# Patient Record
Sex: Female | Born: 1935 | Race: White | Hispanic: No | Smoking: Former smoker
Health system: Southern US, Community
[De-identification: ages and names within clinical notes are randomized; demographics above are authoritative.]

## PROBLEM LIST (undated history)

## (undated) DIAGNOSIS — H6690 Otitis media, unspecified, unspecified ear: Secondary | ICD-10-CM

## (undated) DIAGNOSIS — I10 Essential (primary) hypertension: Secondary | ICD-10-CM

## (undated) HISTORY — PX: WRIST SURGERY: SHX841

## (undated) HISTORY — PX: SHOULDER SURGERY: SHX246

---

## 1998-10-22 ENCOUNTER — Encounter: Payer: Self-pay | Admitting: Internal Medicine

## 1998-10-22 ENCOUNTER — Encounter (INDEPENDENT_AMBULATORY_CARE_PROVIDER_SITE_OTHER): Payer: Self-pay | Admitting: Specialist

## 1998-10-22 ENCOUNTER — Ambulatory Visit (HOSPITAL_COMMUNITY): Admission: RE | Admit: 1998-10-22 | Discharge: 1998-10-22 | Payer: Self-pay | Admitting: Internal Medicine

## 1999-03-29 ENCOUNTER — Encounter: Payer: Self-pay | Admitting: Internal Medicine

## 1999-03-29 ENCOUNTER — Ambulatory Visit (HOSPITAL_COMMUNITY): Admission: RE | Admit: 1999-03-29 | Discharge: 1999-03-29 | Payer: Self-pay | Admitting: Internal Medicine

## 2000-03-31 ENCOUNTER — Ambulatory Visit (HOSPITAL_COMMUNITY): Admission: RE | Admit: 2000-03-31 | Discharge: 2000-03-31 | Payer: Self-pay | Admitting: Internal Medicine

## 2000-03-31 ENCOUNTER — Encounter: Payer: Self-pay | Admitting: Internal Medicine

## 2000-05-21 ENCOUNTER — Ambulatory Visit (HOSPITAL_COMMUNITY): Admission: RE | Admit: 2000-05-21 | Discharge: 2000-05-21 | Payer: Self-pay | Admitting: Orthopedic Surgery

## 2000-05-21 ENCOUNTER — Encounter: Payer: Self-pay | Admitting: Orthopedic Surgery

## 2001-04-02 ENCOUNTER — Encounter: Payer: Self-pay | Admitting: Internal Medicine

## 2001-04-02 ENCOUNTER — Ambulatory Visit (HOSPITAL_COMMUNITY): Admission: RE | Admit: 2001-04-02 | Discharge: 2001-04-02 | Payer: Self-pay | Admitting: Internal Medicine

## 2002-02-18 ENCOUNTER — Encounter: Payer: Self-pay | Admitting: Internal Medicine

## 2002-02-18 ENCOUNTER — Encounter: Admission: RE | Admit: 2002-02-18 | Discharge: 2002-02-18 | Payer: Self-pay | Admitting: Internal Medicine

## 2002-04-04 ENCOUNTER — Ambulatory Visit (HOSPITAL_COMMUNITY): Admission: RE | Admit: 2002-04-04 | Discharge: 2002-04-04 | Payer: Self-pay | Admitting: Internal Medicine

## 2002-04-04 ENCOUNTER — Encounter: Payer: Self-pay | Admitting: Internal Medicine

## 2002-05-04 ENCOUNTER — Encounter: Admission: RE | Admit: 2002-05-04 | Discharge: 2002-05-04 | Payer: Self-pay | Admitting: Internal Medicine

## 2002-05-04 ENCOUNTER — Encounter: Payer: Self-pay | Admitting: Internal Medicine

## 2002-05-12 ENCOUNTER — Encounter (INDEPENDENT_AMBULATORY_CARE_PROVIDER_SITE_OTHER): Payer: Self-pay

## 2002-05-12 ENCOUNTER — Ambulatory Visit (HOSPITAL_COMMUNITY): Admission: RE | Admit: 2002-05-12 | Discharge: 2002-05-12 | Payer: Self-pay | Admitting: Gastroenterology

## 2003-04-05 ENCOUNTER — Ambulatory Visit (HOSPITAL_COMMUNITY): Admission: RE | Admit: 2003-04-05 | Discharge: 2003-04-05 | Payer: Self-pay | Admitting: Internal Medicine

## 2004-04-05 ENCOUNTER — Ambulatory Visit (HOSPITAL_COMMUNITY): Admission: RE | Admit: 2004-04-05 | Discharge: 2004-04-05 | Payer: Self-pay | Admitting: Internal Medicine

## 2004-09-04 ENCOUNTER — Emergency Department (HOSPITAL_COMMUNITY): Admission: EM | Admit: 2004-09-04 | Discharge: 2004-09-04 | Payer: Self-pay | Admitting: Emergency Medicine

## 2004-09-16 ENCOUNTER — Encounter (INDEPENDENT_AMBULATORY_CARE_PROVIDER_SITE_OTHER): Payer: Self-pay | Admitting: Specialist

## 2004-09-16 ENCOUNTER — Encounter: Admission: RE | Admit: 2004-09-16 | Discharge: 2004-09-16 | Payer: Self-pay | Admitting: Internal Medicine

## 2004-10-21 ENCOUNTER — Encounter: Admission: RE | Admit: 2004-10-21 | Discharge: 2004-10-21 | Payer: Self-pay | Admitting: Internal Medicine

## 2005-04-22 ENCOUNTER — Encounter: Admission: RE | Admit: 2005-04-22 | Discharge: 2005-04-22 | Payer: Self-pay | Admitting: Internal Medicine

## 2005-09-17 ENCOUNTER — Encounter: Admission: RE | Admit: 2005-09-17 | Discharge: 2005-09-17 | Payer: Self-pay | Admitting: Internal Medicine

## 2006-04-23 ENCOUNTER — Emergency Department (HOSPITAL_COMMUNITY): Admission: EM | Admit: 2006-04-23 | Discharge: 2006-04-23 | Payer: Self-pay | Admitting: *Deleted

## 2006-04-23 ENCOUNTER — Encounter: Admission: RE | Admit: 2006-04-23 | Discharge: 2006-04-23 | Payer: Self-pay | Admitting: Internal Medicine

## 2006-09-23 ENCOUNTER — Encounter: Admission: RE | Admit: 2006-09-23 | Discharge: 2006-09-23 | Payer: Self-pay | Admitting: Internal Medicine

## 2007-10-04 ENCOUNTER — Encounter: Admission: RE | Admit: 2007-10-04 | Discharge: 2007-10-04 | Payer: Self-pay | Admitting: Internal Medicine

## 2008-01-04 ENCOUNTER — Encounter: Admission: RE | Admit: 2008-01-04 | Discharge: 2008-01-20 | Payer: Self-pay | Admitting: Internal Medicine

## 2008-01-27 ENCOUNTER — Encounter: Admission: RE | Admit: 2008-01-27 | Discharge: 2008-01-27 | Payer: Self-pay | Admitting: Internal Medicine

## 2008-10-04 ENCOUNTER — Encounter: Admission: RE | Admit: 2008-10-04 | Discharge: 2008-10-04 | Payer: Self-pay | Admitting: Internal Medicine

## 2009-02-06 ENCOUNTER — Inpatient Hospital Stay (HOSPITAL_COMMUNITY): Admission: EM | Admit: 2009-02-06 | Discharge: 2009-02-09 | Payer: Self-pay | Admitting: Emergency Medicine

## 2009-10-08 ENCOUNTER — Encounter: Admission: RE | Admit: 2009-10-08 | Discharge: 2009-10-08 | Payer: Self-pay | Admitting: Internal Medicine

## 2010-04-07 LAB — CBC
HCT: 30 % — ABNORMAL LOW (ref 36.0–46.0)
HCT: 31.1 % — ABNORMAL LOW (ref 36.0–46.0)
HCT: 36.1 % (ref 36.0–46.0)
Hemoglobin: 10.7 g/dL — ABNORMAL LOW (ref 12.0–15.0)
Hemoglobin: 12.8 g/dL (ref 12.0–15.0)
MCHC: 35.3 g/dL (ref 30.0–36.0)
MCHC: 35.9 g/dL (ref 30.0–36.0)
MCV: 89.1 fL (ref 78.0–100.0)
MCV: 89.4 fL (ref 78.0–100.0)
MCV: 90.3 fL (ref 78.0–100.0)
Platelets: 163 10*3/uL (ref 150–400)
Platelets: 173 10*3/uL (ref 150–400)
RBC: 3.44 MIL/uL — ABNORMAL LOW (ref 3.87–5.11)
RBC: 4.05 MIL/uL (ref 3.87–5.11)
WBC: 10.2 10*3/uL (ref 4.0–10.5)
WBC: 6.7 10*3/uL (ref 4.0–10.5)
WBC: 6.9 10*3/uL (ref 4.0–10.5)

## 2010-04-07 LAB — DIFFERENTIAL
Basophils Relative: 0 % (ref 0–1)
Eosinophils Absolute: 0 10*3/uL (ref 0.0–0.7)
Eosinophils Relative: 0 % (ref 0–5)
Lymphs Abs: 0.6 10*3/uL — ABNORMAL LOW (ref 0.7–4.0)
Monocytes Absolute: 0.3 10*3/uL (ref 0.1–1.0)
Monocytes Relative: 3 % (ref 3–12)

## 2010-04-07 LAB — BASIC METABOLIC PANEL
BUN: 10 mg/dL (ref 6–23)
BUN: 13 mg/dL (ref 6–23)
CO2: 27 mEq/L (ref 19–32)
CO2: 31 mEq/L (ref 19–32)
Chloride: 100 mEq/L (ref 96–112)
Chloride: 100 mEq/L (ref 96–112)
Chloride: 93 mEq/L — ABNORMAL LOW (ref 96–112)
Creatinine, Ser: 0.61 mg/dL (ref 0.4–1.2)
Creatinine, Ser: 0.78 mg/dL (ref 0.4–1.2)
GFR calc Af Amer: >60 mL/min (ref >60)
GFR calc non Af Amer: >60 mL/min (ref >60)
Glucose, Bld: 109 mg/dL — ABNORMAL HIGH (ref 70–99)
Potassium: 3.2 mEq/L — ABNORMAL LOW (ref 3.5–5.1)
Potassium: 3.6 mEq/L (ref 3.5–5.1)
Potassium: 3.9 mEq/L (ref 3.5–5.1)
Sodium: 136 mEq/L (ref 135–145)

## 2010-04-07 LAB — PROTIME-INR: INR: 0.96 (ref 0.00–1.49)

## 2010-06-07 NOTE — Op Note (Signed)
   NAME:  Tiffany Bradshaw, Tiffany Bradshaw                           ACCOUNT NO.:  [id number]   MEDICAL RECORD NO.:  [id number]                   PATIENT TYPE:  AMB   LOCATION:  ENDO                                 FACILITY:  [facility]   PHYSICIAN:  Danise Edge, M.D.                DATE OF BIRTH:  Oct 24, 1932   DATE OF PROCEDURE:  05/12/2002  DATE OF DISCHARGE:                                 OPERATIVE REPORT   PROCEDURE:  Screening colonoscopy.   REFERRING PHYSICIAN:  Theressa Millard, M.D.   INDICATIONS:  The patient is a 77 year old female born 22-Aug-2032.  The patient  is scheduled to undergo her first screening colonoscopy with polypectomy to  prevent colon cancer.  I discussed with her the complications associated  with colonoscopy and polypectomy, including a 15 per thousand risk of  bleeding and one per thousand risk of colon perforation.  The patient has  signed the operative permit.   ENDOSCOPIST:  Danise Edge, M.D.   PREMEDICATION:  Versed 7.5 mg, fentanyl 75 mcg.   DESCRIPTION OF PROCEDURE:  After obtaining informed consent, the patient was  placed in the left lateral decubitus position.  I administered intravenous  fentanyl and intravenous Versed to achieve conscious sedation for the  procedure. The patient's blood pressure, oxygen saturation, and cardiac  rhythm were monitored throughout the procedure and documented in the medical  record.   Anal inspection was normal.  Digital rectal exam was normal.  The Olympus  pediatric colonoscope was introduced into the rectum and advanced to the  cecum.  Colonic preparation for the exam today was excellent.   Rectum normal.   Sigmoid colon and descending colon:  Left colonic diverticulosis.   Splenic flexure normal.   Transverse colon normal.   Hepatic flexure normal.   Ascending colon normal.   Cecum and ileocecal valve:  A 0.5 mm sessile polyp was removed from the  proximal cecum with the cold biopsy forceps.    ASSESSMENT:  1.  Left colonic diverticulosis.  2. A 0.5 mm sessile polyp was removed from the cecum with the cold biopsy     forceps.   RECOMMENDATIONS:  Repeat colonoscopy in five years if cecal polyp returns  neoplastic pathologically.                                               Danise Edge, M.D.    MJ/MEDQ  D:  05/12/2002  T:  05/12/2002  Job:  [id number]   cc:   Theressa Millard, M.D.  [address] [place]  [place] [address]  Fax: [phone]

## 2010-09-26 ENCOUNTER — Other Ambulatory Visit: Payer: Self-pay | Admitting: Internal Medicine

## 2010-09-26 DIAGNOSIS — Z1231 Encounter for screening mammogram for malignant neoplasm of breast: Secondary | ICD-10-CM

## 2010-10-15 ENCOUNTER — Ambulatory Visit
Admission: RE | Admit: 2010-10-15 | Discharge: 2010-10-15 | Disposition: A | Discharge status: Home / Self Care | Payer: MEDICARE | Source: Ambulatory Visit | Attending: Internal Medicine | Admitting: Internal Medicine

## 2010-10-15 DIAGNOSIS — Z1231 Encounter for screening mammogram for malignant neoplasm of breast: Secondary | ICD-10-CM

## 2011-10-04 ENCOUNTER — Emergency Department (INDEPENDENT_AMBULATORY_CARE_PROVIDER_SITE_OTHER)
Admission: EM | Admit: 2011-10-04 | Discharge: 2011-10-04 | Disposition: A | Discharge status: Home / Self Care | Payer: BC Managed Care – PPO | Source: Home / Self Care | Attending: Family Medicine | Admitting: Family Medicine

## 2011-10-04 ENCOUNTER — Encounter (HOSPITAL_COMMUNITY): Payer: Self-pay | Admitting: Emergency Medicine

## 2011-10-04 DIAGNOSIS — H00014 Hordeolum externum left upper eyelid: Secondary | ICD-10-CM

## 2011-10-04 DIAGNOSIS — H01004 Unspecified blepharitis left upper eyelid: Secondary | ICD-10-CM

## 2011-10-04 DIAGNOSIS — L259 Unspecified contact dermatitis, unspecified cause: Secondary | ICD-10-CM

## 2011-10-04 DIAGNOSIS — H00019 Hordeolum externum unspecified eye, unspecified eyelid: Secondary | ICD-10-CM

## 2011-10-04 DIAGNOSIS — L309 Dermatitis, unspecified: Secondary | ICD-10-CM

## 2011-10-04 DIAGNOSIS — H01009 Unspecified blepharitis unspecified eye, unspecified eyelid: Secondary | ICD-10-CM

## 2011-10-04 HISTORY — DX: Essential (primary) hypertension: I10

## 2011-10-04 MED ORDER — TRIAMCINOLONE ACETONIDE 0.5 % EX OINT: TOPICAL_OINTMENT | Freq: Two times a day (BID) | CUTANEOUS | Status: AC

## 2011-10-04 MED ORDER — DOXYCYCLINE HYCLATE 100 MG PO CAPS: 100.0000 mg | ORAL_CAPSULE | Freq: Two times a day (BID) | ORAL | Status: AC

## 2011-10-04 MED ORDER — TETRACAINE HCL 0.5 % OP SOLN
OPHTHALMIC | Status: AC
  Filled 2011-10-04: qty 2

## 2011-10-04 MED ORDER — ERYTHROMYCIN 5 MG/GM OP OINT: TOPICAL_OINTMENT | OPHTHALMIC | Status: AC

## 2011-10-04 NOTE — ED Notes (Signed)
Pt c/o poss stye on left eye x3 days... Sx include: pain, itching (when blinking), irritation and redness.... She denies: fevers, vomiting, nausea, and diarrhea, and blurry vision.

## 2011-10-04 NOTE — ED Provider Notes (Signed)
History     CSN: [id number]  Arrival date & time 10/04/11  1047   First MD Initiated Contact with Patient 10/04/11 1049      Chief Complaint  Patient presents with  . Eye Problem    (Consider location/radiation/quality/duration/timing/severity/associated sxs/prior treatment) HPI Comments: 78 year old female with history of hypertension. Here with the following complaints: #1 left upper eye itchiness, burning and swelling for 3 days. States that started with itchiness causing her to rub her eyes and later developed pain and swelling, there is a white head in the border of the lid with no spontaneous drainage. No fever or chills. Denies eyeball pain. Denies pain with eye movements. But the left upper lid is tender to touch or with blinking. #2 dry pruriginous rash in both arms and right flank. No prior history of eczema. Took one Benadryl by mouth for her symptoms but Benadryl makes her feel sick.    Past Medical History  Diagnosis Date  . Hypertension     Past Surgical History  Procedure Date  . Shoulder surgery   . Wrist surgery     No family history on file.  History  Substance Use Topics  . Smoking status: Never Smoker   . Smokeless tobacco: Not on file  . Alcohol Use: No    OB History    Grav Para Term Preterm Abortions TAB SAB Ect Mult Living                  Review of Systems  Constitutional: Negative for fever, chills and appetite change.  HENT: Negative for congestion, sore throat and rhinorrhea.   Eyes: Positive for itching. Negative for discharge and visual disturbance.       Eye lid redness and swelling as per HPI  Respiratory: Negative for cough, shortness of breath and wheezing.   Skin: Positive for rash.  Neurological: Negative for dizziness and headaches.    Allergies  Demerol  Home Medications   Current Outpatient Rx  Name Route Sig Dispense Refill  . LIPITOR PO Oral Take by mouth.    Marland Kitchen HYDROCHLOROTHIAZIDE PO Oral Take by mouth.    .  TOPROL XL PO Oral Take by mouth.    . DOXYCYCLINE HYCLATE 100 MG PO CAPS Oral Take 1 capsule (100 mg total) by mouth 2 (two) times daily. 14 capsule 0  . ERYTHROMYCIN 5 MG/GM OP OINT  Place a 1/2 inch ribbon of ointment into the left lower eyelid. 1 g 0  . TRIAMCINOLONE ACETONIDE 0.5 % EX OINT Topical Apply topically 2 (two) times daily. 30 g 0    BP 151/79  Pulse 60  Temp 98 F (36.7 C) (Oral)  Resp 17  SpO2 97%  Physical Exam  Nursing note and vitals reviewed. Constitutional: She is oriented to person, place, and time. She appears well-developed and well-nourished. No distress.  HENT:  Head: Normocephalic and atraumatic.  Nose: Nose normal.  Mouth/Throat: Oropharynx is clear and moist. No oropharyngeal exudate.  Eyes: EOM are normal. Pupils are equal, round, and reactive to light. No scleral icterus.       Bilateral : mild conjunctival erythema with no chemosis. Clear eye tearing. Left eye: there is focal swelling, erythema and white pustule on top of nasal side of the left upper lid there is mild associated blepharitis. Purulent exudate was removed with cotton swab from top of hordeolum.  No significant periorbital erythema, swelling or tenderness.  Neck: Neck supple. No thyromegaly present.  Cardiovascular: Normal heart sounds.  Pulmonary/Chest: Breath sounds normal. No respiratory distress. She has no wheezes. She has no rales.  Abdominal: Soft. She exhibits no mass. There is no tenderness.  Lymphadenopathy:    She has no cervical adenopathy.  Neurological: She is alert and oriented to person, place, and time.  Skin: Rash noted.       Patches of dry peeling skin in volar surfaces anterior to elbow joints in both arms and right lower torso. No raised plaques. No ulcers, abrasions, pustules or skin brakes.     ED Course  Procedures (including critical care time)   Labs Reviewed  CULTURE, ROUTINE-ABSCESS   No results found.   1. Hordeolum externum of left upper eyelid    2. Blepharitis of left upper eyelid   3. Eczema       MDM  Left upper lid hordeolum with mild to moderate reactive blepharitis. Area was scraped and drained today samples sent for culture. Treated with erythromycin ointment and doxycycline orally. Ketotifen drops if persistent itchiness. Eczema was treated with triamcinolone ointment as needed and encouraged skin lubrication. Asked to return or followup with ophthalmologist if persistent or worsening symptoms.        Sharin Grave, MD 10/07/11 1643

## 2011-10-06 ENCOUNTER — Other Ambulatory Visit (HOSPITAL_COMMUNITY): Payer: Self-pay | Admitting: Internal Medicine

## 2011-10-06 ENCOUNTER — Telehealth (HOSPITAL_COMMUNITY): Payer: Self-pay | Admitting: *Deleted

## 2011-10-06 DIAGNOSIS — Z1231 Encounter for screening mammogram for malignant neoplasm of breast: Secondary | ICD-10-CM

## 2011-10-06 LAB — CULTURE, ROUTINE-ABSCESS

## 2011-10-06 NOTE — ED Notes (Signed)
Abscess culture: L upper eyelid: few MRSA.  Pt. adequately treated with Doxycycline.  I called pt. Pt. verified x 2 and given results.  Pt. told she was adeq. treated and given the MRSA instructions.   Vassie Moselle 10/06/2011

## 2011-10-20 ENCOUNTER — Ambulatory Visit: Payer: BC Managed Care – PPO

## 2011-10-24 ENCOUNTER — Ambulatory Visit
Admission: RE | Admit: 2011-10-24 | Discharge: 2011-10-24 | Disposition: A | Discharge status: Home / Self Care | Payer: Medicare Other | Source: Ambulatory Visit | Attending: Internal Medicine | Admitting: Internal Medicine

## 2011-10-24 DIAGNOSIS — Z1231 Encounter for screening mammogram for malignant neoplasm of breast: Secondary | ICD-10-CM

## 2012-12-08 ENCOUNTER — Other Ambulatory Visit: Payer: Self-pay

## 2012-12-08 DIAGNOSIS — Z1231 Encounter for screening mammogram for malignant neoplasm of breast: Secondary | ICD-10-CM

## 2013-01-11 ENCOUNTER — Ambulatory Visit: Payer: Medicare Other

## 2013-02-02 ENCOUNTER — Ambulatory Visit: Payer: Medicaid Other

## 2013-02-09 ENCOUNTER — Ambulatory Visit
Admission: RE | Admit: 2013-02-09 | Discharge: 2013-02-09 | Disposition: A | Discharge status: Home / Self Care | Payer: Medicare Other | Source: Ambulatory Visit

## 2013-02-09 DIAGNOSIS — Z1231 Encounter for screening mammogram for malignant neoplasm of breast: Secondary | ICD-10-CM

## 2014-03-29 ENCOUNTER — Other Ambulatory Visit: Payer: Self-pay

## 2014-03-29 DIAGNOSIS — Z1231 Encounter for screening mammogram for malignant neoplasm of breast: Secondary | ICD-10-CM

## 2014-04-24 ENCOUNTER — Ambulatory Visit
Admission: RE | Admit: 2014-04-24 | Discharge: 2014-04-24 | Disposition: A | Discharge status: Home / Self Care | Payer: Medicare Other | Source: Ambulatory Visit

## 2014-04-24 ENCOUNTER — Encounter (INDEPENDENT_AMBULATORY_CARE_PROVIDER_SITE_OTHER): Payer: Self-pay

## 2014-04-24 DIAGNOSIS — Z1231 Encounter for screening mammogram for malignant neoplasm of breast: Secondary | ICD-10-CM

## 2014-07-07 ENCOUNTER — Emergency Department (HOSPITAL_COMMUNITY)
Admission: EM | Admit: 2014-07-07 | Discharge: 2014-07-07 | Disposition: A | Discharge status: Home / Self Care | Payer: Medicare Other | Attending: Emergency Medicine | Admitting: Emergency Medicine

## 2014-07-07 ENCOUNTER — Encounter (HOSPITAL_COMMUNITY): Payer: Self-pay | Admitting: Emergency Medicine

## 2014-07-07 DIAGNOSIS — H9201 Otalgia, right ear: Secondary | ICD-10-CM | POA: Diagnosis present

## 2014-07-07 DIAGNOSIS — H6691 Otitis media, unspecified, right ear: Secondary | ICD-10-CM | POA: Diagnosis not present

## 2014-07-07 DIAGNOSIS — I10 Essential (primary) hypertension: Secondary | ICD-10-CM | POA: Diagnosis not present

## 2014-07-07 DIAGNOSIS — Z79899 Other long term (current) drug therapy: Secondary | ICD-10-CM | POA: Insufficient documentation

## 2014-07-07 HISTORY — DX: Otitis media, unspecified, unspecified ear: H66.90

## 2014-07-07 MED ORDER — AMOXICILLIN-POT CLAVULANATE 875-125 MG PO TABS: 1.0000 | ORAL_TABLET | Freq: Two times a day (BID) | ORAL | Status: DC

## 2014-07-07 NOTE — ED Provider Notes (Signed)
CSN: [id number]     Arrival date & time 07/07/14  2051 History  This chart was scribed for non-physician provider Antonietta Breach, PA-C, working with Pamella Pert, MD by Irene Pap, ED Scribe. This patient was seen in room WTR5/WTR5 and patient care was started at 9:23 PM.   Chief Complaint  Patient presents with  . Ear Fullness   The history is provided by the patient. No language interpreter was used.    HPI Comments: Tiffany Bradshaw is a 81 y.o. female who presents to the Emergency Department complaining of right ear pain and itching onset two weeks ago. She states that she had an ear infection one year ago in her left ear and has been taking her leftover Ciprodex, 4 drops twice a day to relief. She states that she woke up today and the symptoms worsened and is now feeling "stopped up" and it is hard for her to hear. She denies having trouble hearing with her ear infection last year. She denies ear drainage, nasal congestion, rhinorrhea, postnasal drip, or fever. She states that she got the Ciprodex from [facility]. She states that her PCP is Dr. Wess Botts at [facility]. She denies allergies to medication.   Past Medical History  Diagnosis Date  . Hypertension   . Otitis    Past Surgical History  Procedure Laterality Date  . Shoulder surgery    . Wrist surgery     No family history on file. History  Substance Use Topics  . Smoking status: Never Smoker   . Smokeless tobacco: Never Used  . Alcohol Use: No   OB History    No data available      Review of Systems  Constitutional: Negative for fever.  HENT: Positive for ear pain. Negative for congestion, ear discharge, postnasal drip and rhinorrhea.   All other systems reviewed and are negative.   Allergies  Demerol  Home Medications   Prior to Admission medications   Medication Sig Start Date End Date Taking? Authorizing Provider  amoxicillin-clavulanate (AUGMENTIN) 875-125 MG per tablet Take 1 tablet by  mouth every 12 (twelve) hours. 07/07/14   Antonietta Breach, PA-C  Atorvastatin Calcium (LIPITOR PO) Take by mouth.    Historical Provider, MD  HYDROCHLOROTHIAZIDE PO Take by mouth.    Historical Provider, MD  Metoprolol Succinate (TOPROL XL PO) Take by mouth.    Historical Provider, MD   BP 174/91 mmHg  Pulse 64  Temp(Src) 97.9 F (36.6 C) (Oral)  Resp 18  SpO2 97%  Physical Exam  Constitutional: She is oriented to person, place, and time. She appears well-developed and well-nourished. No distress.  Nontoxic/nonseptic appearing  HENT:  Head: Normocephalic and atraumatic.  Right Ear: External ear and ear canal normal. No mastoid tenderness. Tympanic membrane is erythematous. Tympanic membrane is not perforated.  Left Ear: No mastoid tenderness.  Mouth/Throat: Oropharynx is clear and moist. No oropharyngeal exudate.  Left external ear, canal, and tympanic membrane are normal. Right tympanic membrane is erythematous and dull. Normal right ear canal and external right ear. No evidence of right tympanic membrane perforation. Oropharynx clear. Patient tolerating secretions without difficulty.  Eyes: Conjunctivae and EOM are normal. No scleral icterus.  Neck: Normal range of motion.  No nuchal rigidity or meningismus  Pulmonary/Chest: Effort normal. No respiratory distress.  Respirations even and unlabored  Musculoskeletal: Normal range of motion.  Neurological: She is alert and oriented to person, place, and time. She exhibits normal muscle tone. Coordination normal.  GCS 15.  No focal neurologic deficits appreciated  Skin: Skin is warm and dry. No rash noted. She is not diaphoretic. No erythema. No pallor.  Psychiatric: She has a normal mood and affect. Her behavior is normal.  Nursing note and vitals reviewed.   ED Course  Procedures (including critical care time) DIAGNOSTIC STUDIES: Oxygen Saturation is 97% on RA, normal by my interpretation.    COORDINATION OF CARE: 9:27 PM-Discussed  treatment plan which includes anti-biotics and ENT/PCP follow up with pt at bedside and pt agreed to plan.   Labs Review Labs Reviewed - No data to display  Imaging Review No results found.   EKG Interpretation None      MDM   Final diagnoses:  Acute right otitis media, recurrence not specified, unspecified otitis media type    Patient presents with otalgia and exam consistent with acute otitis media. No concern for acute mastoiditis, meningitis. No improvement with Ciprodex used by patient PTA. Patient discharged home with Augmentin. Advised patient to call PCP for follow-up. I have also discussed reasons to return immediately to the ER. Patient expresses understanding and agrees with plan. Patient discharged in good condition with no unaddressed concerns. Patient seen also by my attending, Dr. Aline Brochure, who is in agreement with this workup, assessment, management plan, and patient's stability for discharge.  I personally performed the services described in this documentation, which was scribed in my presence. The recorded information has been reviewed and is accurate.   Filed Vitals:   07/07/14 2107  BP: 174/91  Pulse: 64  Temp: 97.9 F (36.6 C)  TempSrc: Oral  Resp: 18  SpO2: 97%      Antonietta Breach, PA-C 07/07/14 2209  Pamella Pert, MD 07/07/14 2350

## 2014-07-07 NOTE — Discharge Instructions (Signed)

## 2014-07-07 NOTE — ED Notes (Signed)
Pt states that she began having right ear soreness 2 weeks ago and has been using Ciprodex the last two weeks from a previous ear infection in left ear. Reports pain has resolved but ear began feeling "clogged" today.

## 2015-07-05 ENCOUNTER — Ambulatory Visit (INDEPENDENT_AMBULATORY_CARE_PROVIDER_SITE_OTHER): Payer: Medicare Other | Admitting: Otolaryngology

## 2015-07-05 DIAGNOSIS — H60331 Swimmer's ear, right ear: Secondary | ICD-10-CM

## 2015-07-09 ENCOUNTER — Ambulatory Visit (INDEPENDENT_AMBULATORY_CARE_PROVIDER_SITE_OTHER): Payer: Medicare Other | Admitting: Otolaryngology

## 2015-07-09 DIAGNOSIS — H608X3 Other otitis externa, bilateral: Secondary | ICD-10-CM

## 2016-04-09 DIAGNOSIS — H04123 Dry eye syndrome of bilateral lacrimal glands: Secondary | ICD-10-CM | POA: Diagnosis not present

## 2016-04-09 DIAGNOSIS — H43813 Vitreous degeneration, bilateral: Secondary | ICD-10-CM | POA: Diagnosis not present

## 2016-04-09 DIAGNOSIS — Z961 Presence of intraocular lens: Secondary | ICD-10-CM | POA: Diagnosis not present

## 2016-04-14 DIAGNOSIS — E7800 Pure hypercholesterolemia, unspecified: Secondary | ICD-10-CM | POA: Diagnosis not present

## 2016-04-14 DIAGNOSIS — N182 Chronic kidney disease, stage 2 (mild): Secondary | ICD-10-CM | POA: Diagnosis not present

## 2016-04-14 DIAGNOSIS — M810 Age-related osteoporosis without current pathological fracture: Secondary | ICD-10-CM | POA: Diagnosis not present

## 2016-04-14 DIAGNOSIS — I10 Essential (primary) hypertension: Secondary | ICD-10-CM | POA: Diagnosis not present

## 2016-04-14 DIAGNOSIS — I872 Venous insufficiency (chronic) (peripheral): Secondary | ICD-10-CM | POA: Diagnosis not present

## 2016-04-14 DIAGNOSIS — R002 Palpitations: Secondary | ICD-10-CM | POA: Diagnosis not present

## 2016-04-14 DIAGNOSIS — J309 Allergic rhinitis, unspecified: Secondary | ICD-10-CM | POA: Diagnosis not present

## 2016-04-14 DIAGNOSIS — E559 Vitamin D deficiency, unspecified: Secondary | ICD-10-CM | POA: Diagnosis not present

## 2016-06-13 DIAGNOSIS — Z961 Presence of intraocular lens: Secondary | ICD-10-CM | POA: Diagnosis not present

## 2016-06-13 DIAGNOSIS — H43813 Vitreous degeneration, bilateral: Secondary | ICD-10-CM | POA: Diagnosis not present

## 2016-06-13 DIAGNOSIS — H04123 Dry eye syndrome of bilateral lacrimal glands: Secondary | ICD-10-CM | POA: Diagnosis not present

## 2016-07-30 DIAGNOSIS — E7800 Pure hypercholesterolemia, unspecified: Secondary | ICD-10-CM | POA: Diagnosis not present

## 2016-07-30 DIAGNOSIS — Z7982 Long term (current) use of aspirin: Secondary | ICD-10-CM | POA: Diagnosis not present

## 2016-07-30 DIAGNOSIS — R600 Localized edema: Secondary | ICD-10-CM | POA: Diagnosis not present

## 2016-07-30 DIAGNOSIS — R001 Bradycardia, unspecified: Secondary | ICD-10-CM | POA: Diagnosis not present

## 2016-07-30 DIAGNOSIS — H9113 Presbycusis, bilateral: Secondary | ICD-10-CM | POA: Diagnosis not present

## 2016-07-30 DIAGNOSIS — Z6824 Body mass index (BMI) 24.0-24.9, adult: Secondary | ICD-10-CM | POA: Diagnosis not present

## 2016-07-30 DIAGNOSIS — Z0000 Encounter for general adult medical examination without abnormal findings: Secondary | ICD-10-CM | POA: Diagnosis not present

## 2016-07-30 DIAGNOSIS — E559 Vitamin D deficiency, unspecified: Secondary | ICD-10-CM | POA: Diagnosis not present

## 2016-09-15 DIAGNOSIS — R69 Illness, unspecified: Secondary | ICD-10-CM | POA: Diagnosis not present

## 2016-09-23 DIAGNOSIS — R69 Illness, unspecified: Secondary | ICD-10-CM | POA: Diagnosis not present

## 2016-10-08 DIAGNOSIS — R69 Illness, unspecified: Secondary | ICD-10-CM | POA: Diagnosis not present

## 2016-10-23 DIAGNOSIS — Z0000 Encounter for general adult medical examination without abnormal findings: Secondary | ICD-10-CM | POA: Diagnosis not present

## 2016-10-23 DIAGNOSIS — N182 Chronic kidney disease, stage 2 (mild): Secondary | ICD-10-CM | POA: Diagnosis not present

## 2016-10-23 DIAGNOSIS — I872 Venous insufficiency (chronic) (peripheral): Secondary | ICD-10-CM | POA: Diagnosis not present

## 2016-10-23 DIAGNOSIS — Z23 Encounter for immunization: Secondary | ICD-10-CM | POA: Diagnosis not present

## 2016-10-23 DIAGNOSIS — E7800 Pure hypercholesterolemia, unspecified: Secondary | ICD-10-CM | POA: Diagnosis not present

## 2016-10-23 DIAGNOSIS — I10 Essential (primary) hypertension: Secondary | ICD-10-CM | POA: Diagnosis not present

## 2016-10-23 DIAGNOSIS — Z1211 Encounter for screening for malignant neoplasm of colon: Secondary | ICD-10-CM | POA: Diagnosis not present

## 2016-10-23 DIAGNOSIS — M810 Age-related osteoporosis without current pathological fracture: Secondary | ICD-10-CM | POA: Diagnosis not present

## 2016-10-23 DIAGNOSIS — Z1389 Encounter for screening for other disorder: Secondary | ICD-10-CM | POA: Diagnosis not present

## 2016-10-23 DIAGNOSIS — J309 Allergic rhinitis, unspecified: Secondary | ICD-10-CM | POA: Diagnosis not present

## 2016-10-23 DIAGNOSIS — E559 Vitamin D deficiency, unspecified: Secondary | ICD-10-CM | POA: Diagnosis not present

## 2016-10-31 DIAGNOSIS — Z1211 Encounter for screening for malignant neoplasm of colon: Secondary | ICD-10-CM | POA: Diagnosis not present

## 2017-04-23 DIAGNOSIS — I10 Essential (primary) hypertension: Secondary | ICD-10-CM | POA: Diagnosis not present

## 2017-04-23 DIAGNOSIS — J309 Allergic rhinitis, unspecified: Secondary | ICD-10-CM | POA: Diagnosis not present

## 2017-04-23 DIAGNOSIS — E7800 Pure hypercholesterolemia, unspecified: Secondary | ICD-10-CM | POA: Diagnosis not present

## 2017-04-23 DIAGNOSIS — M810 Age-related osteoporosis without current pathological fracture: Secondary | ICD-10-CM | POA: Diagnosis not present

## 2017-04-23 DIAGNOSIS — I872 Venous insufficiency (chronic) (peripheral): Secondary | ICD-10-CM | POA: Diagnosis not present

## 2017-04-23 DIAGNOSIS — N182 Chronic kidney disease, stage 2 (mild): Secondary | ICD-10-CM | POA: Diagnosis not present

## 2017-04-30 DIAGNOSIS — M810 Age-related osteoporosis without current pathological fracture: Secondary | ICD-10-CM | POA: Diagnosis not present

## 2017-05-14 DIAGNOSIS — M810 Age-related osteoporosis without current pathological fracture: Secondary | ICD-10-CM | POA: Diagnosis not present

## 2017-06-01 DIAGNOSIS — E785 Hyperlipidemia, unspecified: Secondary | ICD-10-CM | POA: Diagnosis not present

## 2017-06-01 DIAGNOSIS — Z8249 Family history of ischemic heart disease and other diseases of the circulatory system: Secondary | ICD-10-CM | POA: Diagnosis not present

## 2017-06-01 DIAGNOSIS — M810 Age-related osteoporosis without current pathological fracture: Secondary | ICD-10-CM | POA: Diagnosis not present

## 2017-06-01 DIAGNOSIS — Z7983 Long term (current) use of bisphosphonates: Secondary | ICD-10-CM | POA: Diagnosis not present

## 2017-06-01 DIAGNOSIS — Z809 Family history of malignant neoplasm, unspecified: Secondary | ICD-10-CM | POA: Diagnosis not present

## 2017-06-01 DIAGNOSIS — Z87891 Personal history of nicotine dependence: Secondary | ICD-10-CM | POA: Diagnosis not present

## 2017-06-01 DIAGNOSIS — I10 Essential (primary) hypertension: Secondary | ICD-10-CM | POA: Diagnosis not present

## 2017-06-18 DIAGNOSIS — Z961 Presence of intraocular lens: Secondary | ICD-10-CM | POA: Diagnosis not present

## 2017-06-18 DIAGNOSIS — H04123 Dry eye syndrome of bilateral lacrimal glands: Secondary | ICD-10-CM | POA: Diagnosis not present

## 2017-06-18 DIAGNOSIS — H43813 Vitreous degeneration, bilateral: Secondary | ICD-10-CM | POA: Diagnosis not present

## 2017-10-19 DIAGNOSIS — R05 Cough: Secondary | ICD-10-CM | POA: Diagnosis not present

## 2017-10-19 DIAGNOSIS — R0981 Nasal congestion: Secondary | ICD-10-CM | POA: Diagnosis not present

## 2017-10-26 ENCOUNTER — Other Ambulatory Visit: Payer: Self-pay | Admitting: Internal Medicine

## 2017-10-26 ENCOUNTER — Ambulatory Visit (HOSPITAL_COMMUNITY)
Admission: RE | Admit: 2017-10-26 | Discharge: 2017-10-26 | Disposition: A | Discharge status: Home / Self Care | Payer: Medicare HMO | Source: Ambulatory Visit | Attending: Internal Medicine | Admitting: Internal Medicine

## 2017-10-26 ENCOUNTER — Other Ambulatory Visit (HOSPITAL_COMMUNITY): Payer: Self-pay | Admitting: Internal Medicine

## 2017-10-26 DIAGNOSIS — J309 Allergic rhinitis, unspecified: Secondary | ICD-10-CM | POA: Diagnosis not present

## 2017-10-26 DIAGNOSIS — R1011 Right upper quadrant pain: Secondary | ICD-10-CM | POA: Insufficient documentation

## 2017-10-26 DIAGNOSIS — M519 Unspecified thoracic, thoracolumbar and lumbosacral intervertebral disc disorder: Secondary | ICD-10-CM | POA: Diagnosis not present

## 2017-10-26 DIAGNOSIS — Z0000 Encounter for general adult medical examination without abnormal findings: Secondary | ICD-10-CM | POA: Diagnosis not present

## 2017-10-26 DIAGNOSIS — Z1389 Encounter for screening for other disorder: Secondary | ICD-10-CM | POA: Diagnosis not present

## 2017-10-26 DIAGNOSIS — R0981 Nasal congestion: Secondary | ICD-10-CM | POA: Diagnosis not present

## 2017-10-26 DIAGNOSIS — I10 Essential (primary) hypertension: Secondary | ICD-10-CM | POA: Diagnosis not present

## 2017-10-26 DIAGNOSIS — Z23 Encounter for immunization: Secondary | ICD-10-CM | POA: Diagnosis not present

## 2017-10-26 DIAGNOSIS — I872 Venous insufficiency (chronic) (peripheral): Secondary | ICD-10-CM | POA: Diagnosis not present

## 2017-10-26 DIAGNOSIS — N182 Chronic kidney disease, stage 2 (mild): Secondary | ICD-10-CM | POA: Diagnosis not present

## 2017-10-26 DIAGNOSIS — E7800 Pure hypercholesterolemia, unspecified: Secondary | ICD-10-CM | POA: Diagnosis not present

## 2017-10-26 DIAGNOSIS — M810 Age-related osteoporosis without current pathological fracture: Secondary | ICD-10-CM | POA: Diagnosis not present

## 2018-06-28 DIAGNOSIS — I10 Essential (primary) hypertension: Secondary | ICD-10-CM | POA: Diagnosis not present

## 2018-06-28 DIAGNOSIS — N182 Chronic kidney disease, stage 2 (mild): Secondary | ICD-10-CM | POA: Diagnosis not present

## 2018-06-28 DIAGNOSIS — M810 Age-related osteoporosis without current pathological fracture: Secondary | ICD-10-CM | POA: Diagnosis not present

## 2018-06-28 DIAGNOSIS — K219 Gastro-esophageal reflux disease without esophagitis: Secondary | ICD-10-CM | POA: Diagnosis not present

## 2018-06-28 DIAGNOSIS — E7800 Pure hypercholesterolemia, unspecified: Secondary | ICD-10-CM | POA: Diagnosis not present

## 2018-09-09 DIAGNOSIS — H26491 Other secondary cataract, right eye: Secondary | ICD-10-CM | POA: Diagnosis not present

## 2018-09-09 DIAGNOSIS — Z961 Presence of intraocular lens: Secondary | ICD-10-CM | POA: Diagnosis not present

## 2018-09-09 DIAGNOSIS — H04123 Dry eye syndrome of bilateral lacrimal glands: Secondary | ICD-10-CM | POA: Diagnosis not present

## 2018-09-09 DIAGNOSIS — H43813 Vitreous degeneration, bilateral: Secondary | ICD-10-CM | POA: Diagnosis not present

## 2018-11-02 DIAGNOSIS — Z23 Encounter for immunization: Secondary | ICD-10-CM | POA: Diagnosis not present

## 2018-11-02 DIAGNOSIS — Z1211 Encounter for screening for malignant neoplasm of colon: Secondary | ICD-10-CM | POA: Diagnosis not present

## 2018-11-02 DIAGNOSIS — J309 Allergic rhinitis, unspecified: Secondary | ICD-10-CM | POA: Diagnosis not present

## 2018-11-02 DIAGNOSIS — I10 Essential (primary) hypertension: Secondary | ICD-10-CM | POA: Diagnosis not present

## 2018-11-02 DIAGNOSIS — Z0000 Encounter for general adult medical examination without abnormal findings: Secondary | ICD-10-CM | POA: Diagnosis not present

## 2018-11-02 DIAGNOSIS — Z1389 Encounter for screening for other disorder: Secondary | ICD-10-CM | POA: Diagnosis not present

## 2018-11-02 DIAGNOSIS — N182 Chronic kidney disease, stage 2 (mild): Secondary | ICD-10-CM | POA: Diagnosis not present

## 2018-11-02 DIAGNOSIS — E559 Vitamin D deficiency, unspecified: Secondary | ICD-10-CM | POA: Diagnosis not present

## 2018-11-02 DIAGNOSIS — M810 Age-related osteoporosis without current pathological fracture: Secondary | ICD-10-CM | POA: Diagnosis not present

## 2018-11-02 DIAGNOSIS — I872 Venous insufficiency (chronic) (peripheral): Secondary | ICD-10-CM | POA: Diagnosis not present

## 2018-11-02 DIAGNOSIS — M519 Unspecified thoracic, thoracolumbar and lumbosacral intervertebral disc disorder: Secondary | ICD-10-CM | POA: Diagnosis not present

## 2018-11-02 DIAGNOSIS — E7800 Pure hypercholesterolemia, unspecified: Secondary | ICD-10-CM | POA: Diagnosis not present

## 2018-11-05 DIAGNOSIS — Z1211 Encounter for screening for malignant neoplasm of colon: Secondary | ICD-10-CM | POA: Diagnosis not present

## 2018-12-06 DIAGNOSIS — R69 Illness, unspecified: Secondary | ICD-10-CM | POA: Diagnosis not present

## 2018-12-14 ENCOUNTER — Other Ambulatory Visit: Payer: Self-pay

## 2018-12-14 DIAGNOSIS — Z20822 Contact with and (suspected) exposure to covid-19: Secondary | ICD-10-CM

## 2018-12-15 LAB — NOVEL CORONAVIRUS, NAA: SARS-CoV-2, NAA: NOT DETECTED

## 2018-12-17 ENCOUNTER — Telehealth: Payer: Self-pay | Admitting: General Practice

## 2018-12-17 NOTE — Telephone Encounter (Signed)
Gave patient negative covid test results Patient understood 

## 2019-02-20 ENCOUNTER — Ambulatory Visit: Payer: Medicare HMO

## 2019-03-03 ENCOUNTER — Ambulatory Visit: Payer: Medicare HMO

## 2019-04-05 DIAGNOSIS — H269 Unspecified cataract: Secondary | ICD-10-CM | POA: Diagnosis not present

## 2019-04-05 DIAGNOSIS — Z008 Encounter for other general examination: Secondary | ICD-10-CM | POA: Diagnosis not present

## 2019-04-05 DIAGNOSIS — I739 Peripheral vascular disease, unspecified: Secondary | ICD-10-CM | POA: Diagnosis not present

## 2019-04-05 DIAGNOSIS — E785 Hyperlipidemia, unspecified: Secondary | ICD-10-CM | POA: Diagnosis not present

## 2019-04-05 DIAGNOSIS — Z87891 Personal history of nicotine dependence: Secondary | ICD-10-CM | POA: Diagnosis not present

## 2019-04-05 DIAGNOSIS — I10 Essential (primary) hypertension: Secondary | ICD-10-CM | POA: Diagnosis not present

## 2019-04-05 DIAGNOSIS — H04129 Dry eye syndrome of unspecified lacrimal gland: Secondary | ICD-10-CM | POA: Diagnosis not present

## 2019-04-05 DIAGNOSIS — Z8249 Family history of ischemic heart disease and other diseases of the circulatory system: Secondary | ICD-10-CM | POA: Diagnosis not present

## 2019-04-18 DIAGNOSIS — J45909 Unspecified asthma, uncomplicated: Secondary | ICD-10-CM | POA: Diagnosis not present

## 2019-04-18 DIAGNOSIS — I10 Essential (primary) hypertension: Secondary | ICD-10-CM | POA: Diagnosis not present

## 2019-04-18 DIAGNOSIS — N182 Chronic kidney disease, stage 2 (mild): Secondary | ICD-10-CM | POA: Diagnosis not present

## 2019-04-18 DIAGNOSIS — E7800 Pure hypercholesterolemia, unspecified: Secondary | ICD-10-CM | POA: Diagnosis not present

## 2019-04-18 DIAGNOSIS — M810 Age-related osteoporosis without current pathological fracture: Secondary | ICD-10-CM | POA: Diagnosis not present

## 2019-05-04 DIAGNOSIS — I872 Venous insufficiency (chronic) (peripheral): Secondary | ICD-10-CM | POA: Diagnosis not present

## 2019-05-04 DIAGNOSIS — J309 Allergic rhinitis, unspecified: Secondary | ICD-10-CM | POA: Diagnosis not present

## 2019-05-04 DIAGNOSIS — M810 Age-related osteoporosis without current pathological fracture: Secondary | ICD-10-CM | POA: Diagnosis not present

## 2019-05-04 DIAGNOSIS — N182 Chronic kidney disease, stage 2 (mild): Secondary | ICD-10-CM | POA: Diagnosis not present

## 2019-05-04 DIAGNOSIS — E7800 Pure hypercholesterolemia, unspecified: Secondary | ICD-10-CM | POA: Diagnosis not present

## 2019-05-04 DIAGNOSIS — I10 Essential (primary) hypertension: Secondary | ICD-10-CM | POA: Diagnosis not present

## 2019-05-04 DIAGNOSIS — E559 Vitamin D deficiency, unspecified: Secondary | ICD-10-CM | POA: Diagnosis not present

## 2019-05-13 DIAGNOSIS — E7800 Pure hypercholesterolemia, unspecified: Secondary | ICD-10-CM | POA: Diagnosis not present

## 2019-05-13 DIAGNOSIS — M810 Age-related osteoporosis without current pathological fracture: Secondary | ICD-10-CM | POA: Diagnosis not present

## 2019-05-13 DIAGNOSIS — N182 Chronic kidney disease, stage 2 (mild): Secondary | ICD-10-CM | POA: Diagnosis not present

## 2019-05-13 DIAGNOSIS — J45909 Unspecified asthma, uncomplicated: Secondary | ICD-10-CM | POA: Diagnosis not present

## 2019-05-13 DIAGNOSIS — I10 Essential (primary) hypertension: Secondary | ICD-10-CM | POA: Diagnosis not present

## 2019-06-13 DIAGNOSIS — R69 Illness, unspecified: Secondary | ICD-10-CM | POA: Diagnosis not present

## 2019-07-14 DIAGNOSIS — R1011 Right upper quadrant pain: Secondary | ICD-10-CM | POA: Diagnosis not present

## 2019-07-20 DIAGNOSIS — J45909 Unspecified asthma, uncomplicated: Secondary | ICD-10-CM | POA: Diagnosis not present

## 2019-07-20 DIAGNOSIS — M810 Age-related osteoporosis without current pathological fracture: Secondary | ICD-10-CM | POA: Diagnosis not present

## 2019-07-20 DIAGNOSIS — E7800 Pure hypercholesterolemia, unspecified: Secondary | ICD-10-CM | POA: Diagnosis not present

## 2019-07-20 DIAGNOSIS — N182 Chronic kidney disease, stage 2 (mild): Secondary | ICD-10-CM | POA: Diagnosis not present

## 2019-07-20 DIAGNOSIS — I10 Essential (primary) hypertension: Secondary | ICD-10-CM | POA: Diagnosis not present

## 2019-07-31 DIAGNOSIS — E7800 Pure hypercholesterolemia, unspecified: Secondary | ICD-10-CM | POA: Diagnosis not present

## 2019-07-31 DIAGNOSIS — J45909 Unspecified asthma, uncomplicated: Secondary | ICD-10-CM | POA: Diagnosis not present

## 2019-07-31 DIAGNOSIS — M810 Age-related osteoporosis without current pathological fracture: Secondary | ICD-10-CM | POA: Diagnosis not present

## 2019-07-31 DIAGNOSIS — I10 Essential (primary) hypertension: Secondary | ICD-10-CM | POA: Diagnosis not present

## 2019-07-31 DIAGNOSIS — N182 Chronic kidney disease, stage 2 (mild): Secondary | ICD-10-CM | POA: Diagnosis not present

## 2019-08-30 DIAGNOSIS — M810 Age-related osteoporosis without current pathological fracture: Secondary | ICD-10-CM | POA: Diagnosis not present

## 2019-08-30 DIAGNOSIS — E7800 Pure hypercholesterolemia, unspecified: Secondary | ICD-10-CM | POA: Diagnosis not present

## 2019-08-30 DIAGNOSIS — N182 Chronic kidney disease, stage 2 (mild): Secondary | ICD-10-CM | POA: Diagnosis not present

## 2019-08-30 DIAGNOSIS — J45909 Unspecified asthma, uncomplicated: Secondary | ICD-10-CM | POA: Diagnosis not present

## 2019-08-30 DIAGNOSIS — I10 Essential (primary) hypertension: Secondary | ICD-10-CM | POA: Diagnosis not present

## 2019-09-12 DIAGNOSIS — H43813 Vitreous degeneration, bilateral: Secondary | ICD-10-CM | POA: Diagnosis not present

## 2019-09-12 DIAGNOSIS — H04123 Dry eye syndrome of bilateral lacrimal glands: Secondary | ICD-10-CM | POA: Diagnosis not present

## 2019-09-12 DIAGNOSIS — H26491 Other secondary cataract, right eye: Secondary | ICD-10-CM | POA: Diagnosis not present

## 2019-09-12 DIAGNOSIS — Z961 Presence of intraocular lens: Secondary | ICD-10-CM | POA: Diagnosis not present

## 2019-09-12 DIAGNOSIS — H1045 Other chronic allergic conjunctivitis: Secondary | ICD-10-CM | POA: Diagnosis not present

## 2019-09-22 DIAGNOSIS — L570 Actinic keratosis: Secondary | ICD-10-CM | POA: Diagnosis not present

## 2019-09-22 DIAGNOSIS — D225 Melanocytic nevi of trunk: Secondary | ICD-10-CM | POA: Diagnosis not present

## 2019-09-22 DIAGNOSIS — X32XXXA Exposure to sunlight, initial encounter: Secondary | ICD-10-CM | POA: Diagnosis not present

## 2019-09-22 DIAGNOSIS — L820 Inflamed seborrheic keratosis: Secondary | ICD-10-CM | POA: Diagnosis not present

## 2019-09-26 DIAGNOSIS — R300 Dysuria: Secondary | ICD-10-CM | POA: Diagnosis not present

## 2019-09-26 DIAGNOSIS — R1030 Lower abdominal pain, unspecified: Secondary | ICD-10-CM | POA: Diagnosis not present

## 2019-09-26 DIAGNOSIS — R350 Frequency of micturition: Secondary | ICD-10-CM | POA: Diagnosis not present

## 2019-09-26 DIAGNOSIS — R109 Unspecified abdominal pain: Secondary | ICD-10-CM | POA: Diagnosis not present

## 2019-09-29 DIAGNOSIS — N182 Chronic kidney disease, stage 2 (mild): Secondary | ICD-10-CM | POA: Diagnosis not present

## 2019-09-29 DIAGNOSIS — I10 Essential (primary) hypertension: Secondary | ICD-10-CM | POA: Diagnosis not present

## 2019-09-29 DIAGNOSIS — M810 Age-related osteoporosis without current pathological fracture: Secondary | ICD-10-CM | POA: Diagnosis not present

## 2019-09-29 DIAGNOSIS — J45909 Unspecified asthma, uncomplicated: Secondary | ICD-10-CM | POA: Diagnosis not present

## 2019-09-29 DIAGNOSIS — E7800 Pure hypercholesterolemia, unspecified: Secondary | ICD-10-CM | POA: Diagnosis not present

## 2019-10-28 DIAGNOSIS — H6121 Impacted cerumen, right ear: Secondary | ICD-10-CM | POA: Diagnosis not present

## 2019-11-07 DIAGNOSIS — N182 Chronic kidney disease, stage 2 (mild): Secondary | ICD-10-CM | POA: Diagnosis not present

## 2019-11-07 DIAGNOSIS — Z0000 Encounter for general adult medical examination without abnormal findings: Secondary | ICD-10-CM | POA: Diagnosis not present

## 2019-11-07 DIAGNOSIS — R8290 Unspecified abnormal findings in urine: Secondary | ICD-10-CM | POA: Diagnosis not present

## 2019-11-07 DIAGNOSIS — M810 Age-related osteoporosis without current pathological fracture: Secondary | ICD-10-CM | POA: Diagnosis not present

## 2019-11-07 DIAGNOSIS — K219 Gastro-esophageal reflux disease without esophagitis: Secondary | ICD-10-CM | POA: Diagnosis not present

## 2019-11-07 DIAGNOSIS — E7800 Pure hypercholesterolemia, unspecified: Secondary | ICD-10-CM | POA: Diagnosis not present

## 2019-11-07 DIAGNOSIS — Z87440 Personal history of urinary (tract) infections: Secondary | ICD-10-CM | POA: Diagnosis not present

## 2019-11-07 DIAGNOSIS — I872 Venous insufficiency (chronic) (peripheral): Secondary | ICD-10-CM | POA: Diagnosis not present

## 2019-11-07 DIAGNOSIS — I10 Essential (primary) hypertension: Secondary | ICD-10-CM | POA: Diagnosis not present

## 2019-11-07 DIAGNOSIS — E559 Vitamin D deficiency, unspecified: Secondary | ICD-10-CM | POA: Diagnosis not present

## 2019-11-09 DIAGNOSIS — Z0000 Encounter for general adult medical examination without abnormal findings: Secondary | ICD-10-CM | POA: Diagnosis not present

## 2019-11-09 DIAGNOSIS — Z1211 Encounter for screening for malignant neoplasm of colon: Secondary | ICD-10-CM | POA: Diagnosis not present

## 2019-11-18 DIAGNOSIS — J45909 Unspecified asthma, uncomplicated: Secondary | ICD-10-CM | POA: Diagnosis not present

## 2019-11-18 DIAGNOSIS — E7800 Pure hypercholesterolemia, unspecified: Secondary | ICD-10-CM | POA: Diagnosis not present

## 2019-11-18 DIAGNOSIS — N182 Chronic kidney disease, stage 2 (mild): Secondary | ICD-10-CM | POA: Diagnosis not present

## 2019-11-18 DIAGNOSIS — I10 Essential (primary) hypertension: Secondary | ICD-10-CM | POA: Diagnosis not present

## 2019-11-18 DIAGNOSIS — M810 Age-related osteoporosis without current pathological fracture: Secondary | ICD-10-CM | POA: Diagnosis not present

## 2019-11-28 DIAGNOSIS — N182 Chronic kidney disease, stage 2 (mild): Secondary | ICD-10-CM | POA: Diagnosis not present

## 2019-11-28 DIAGNOSIS — J45909 Unspecified asthma, uncomplicated: Secondary | ICD-10-CM | POA: Diagnosis not present

## 2019-11-28 DIAGNOSIS — M810 Age-related osteoporosis without current pathological fracture: Secondary | ICD-10-CM | POA: Diagnosis not present

## 2019-11-28 DIAGNOSIS — K219 Gastro-esophageal reflux disease without esophagitis: Secondary | ICD-10-CM | POA: Diagnosis not present

## 2019-11-28 DIAGNOSIS — E7800 Pure hypercholesterolemia, unspecified: Secondary | ICD-10-CM | POA: Diagnosis not present

## 2019-11-28 DIAGNOSIS — I10 Essential (primary) hypertension: Secondary | ICD-10-CM | POA: Diagnosis not present

## 2019-12-14 DIAGNOSIS — R69 Illness, unspecified: Secondary | ICD-10-CM | POA: Diagnosis not present

## 2020-01-19 DIAGNOSIS — K219 Gastro-esophageal reflux disease without esophagitis: Secondary | ICD-10-CM | POA: Diagnosis not present

## 2020-01-19 DIAGNOSIS — N182 Chronic kidney disease, stage 2 (mild): Secondary | ICD-10-CM | POA: Diagnosis not present

## 2020-01-19 DIAGNOSIS — I10 Essential (primary) hypertension: Secondary | ICD-10-CM | POA: Diagnosis not present

## 2020-01-19 DIAGNOSIS — E7800 Pure hypercholesterolemia, unspecified: Secondary | ICD-10-CM | POA: Diagnosis not present

## 2020-01-19 DIAGNOSIS — M810 Age-related osteoporosis without current pathological fracture: Secondary | ICD-10-CM | POA: Diagnosis not present

## 2020-01-19 DIAGNOSIS — J45909 Unspecified asthma, uncomplicated: Secondary | ICD-10-CM | POA: Diagnosis not present

## 2020-03-08 DIAGNOSIS — H1045 Other chronic allergic conjunctivitis: Secondary | ICD-10-CM | POA: Diagnosis not present

## 2020-03-08 DIAGNOSIS — Z961 Presence of intraocular lens: Secondary | ICD-10-CM | POA: Diagnosis not present

## 2020-03-08 DIAGNOSIS — H43813 Vitreous degeneration, bilateral: Secondary | ICD-10-CM | POA: Diagnosis not present

## 2020-03-08 DIAGNOSIS — H26491 Other secondary cataract, right eye: Secondary | ICD-10-CM | POA: Diagnosis not present

## 2020-03-08 DIAGNOSIS — H04123 Dry eye syndrome of bilateral lacrimal glands: Secondary | ICD-10-CM | POA: Diagnosis not present

## 2020-03-12 DIAGNOSIS — N182 Chronic kidney disease, stage 2 (mild): Secondary | ICD-10-CM | POA: Diagnosis not present

## 2020-03-12 DIAGNOSIS — M810 Age-related osteoporosis without current pathological fracture: Secondary | ICD-10-CM | POA: Diagnosis not present

## 2020-03-12 DIAGNOSIS — J45909 Unspecified asthma, uncomplicated: Secondary | ICD-10-CM | POA: Diagnosis not present

## 2020-03-12 DIAGNOSIS — I10 Essential (primary) hypertension: Secondary | ICD-10-CM | POA: Diagnosis not present

## 2020-03-12 DIAGNOSIS — E7800 Pure hypercholesterolemia, unspecified: Secondary | ICD-10-CM | POA: Diagnosis not present

## 2020-03-12 DIAGNOSIS — K219 Gastro-esophageal reflux disease without esophagitis: Secondary | ICD-10-CM | POA: Diagnosis not present

## 2020-03-22 DIAGNOSIS — Z008 Encounter for other general examination: Secondary | ICD-10-CM | POA: Diagnosis not present

## 2020-03-22 DIAGNOSIS — Z8249 Family history of ischemic heart disease and other diseases of the circulatory system: Secondary | ICD-10-CM | POA: Diagnosis not present

## 2020-03-22 DIAGNOSIS — R32 Unspecified urinary incontinence: Secondary | ICD-10-CM | POA: Diagnosis not present

## 2020-03-22 DIAGNOSIS — M810 Age-related osteoporosis without current pathological fracture: Secondary | ICD-10-CM | POA: Diagnosis not present

## 2020-03-22 DIAGNOSIS — Z7722 Contact with and (suspected) exposure to environmental tobacco smoke (acute) (chronic): Secondary | ICD-10-CM | POA: Diagnosis not present

## 2020-03-22 DIAGNOSIS — Z87891 Personal history of nicotine dependence: Secondary | ICD-10-CM | POA: Diagnosis not present

## 2020-03-22 DIAGNOSIS — E785 Hyperlipidemia, unspecified: Secondary | ICD-10-CM | POA: Diagnosis not present

## 2020-03-22 DIAGNOSIS — I10 Essential (primary) hypertension: Secondary | ICD-10-CM | POA: Diagnosis not present

## 2020-03-22 DIAGNOSIS — I499 Cardiac arrhythmia, unspecified: Secondary | ICD-10-CM | POA: Diagnosis not present

## 2020-03-23 DIAGNOSIS — E7800 Pure hypercholesterolemia, unspecified: Secondary | ICD-10-CM | POA: Diagnosis not present

## 2020-03-23 DIAGNOSIS — I10 Essential (primary) hypertension: Secondary | ICD-10-CM | POA: Diagnosis not present

## 2020-03-23 DIAGNOSIS — K219 Gastro-esophageal reflux disease without esophagitis: Secondary | ICD-10-CM | POA: Diagnosis not present

## 2020-03-23 DIAGNOSIS — M810 Age-related osteoporosis without current pathological fracture: Secondary | ICD-10-CM | POA: Diagnosis not present

## 2020-03-23 DIAGNOSIS — J45909 Unspecified asthma, uncomplicated: Secondary | ICD-10-CM | POA: Diagnosis not present

## 2020-03-23 DIAGNOSIS — N182 Chronic kidney disease, stage 2 (mild): Secondary | ICD-10-CM | POA: Diagnosis not present

## 2020-05-07 DIAGNOSIS — E559 Vitamin D deficiency, unspecified: Secondary | ICD-10-CM | POA: Diagnosis not present

## 2020-05-07 DIAGNOSIS — N182 Chronic kidney disease, stage 2 (mild): Secondary | ICD-10-CM | POA: Diagnosis not present

## 2020-05-07 DIAGNOSIS — I10 Essential (primary) hypertension: Secondary | ICD-10-CM | POA: Diagnosis not present

## 2020-05-07 DIAGNOSIS — E7800 Pure hypercholesterolemia, unspecified: Secondary | ICD-10-CM | POA: Diagnosis not present

## 2020-05-07 DIAGNOSIS — R002 Palpitations: Secondary | ICD-10-CM | POA: Diagnosis not present

## 2020-05-07 DIAGNOSIS — L659 Nonscarring hair loss, unspecified: Secondary | ICD-10-CM | POA: Diagnosis not present

## 2020-05-07 DIAGNOSIS — I872 Venous insufficiency (chronic) (peripheral): Secondary | ICD-10-CM | POA: Diagnosis not present

## 2020-05-07 DIAGNOSIS — M810 Age-related osteoporosis without current pathological fracture: Secondary | ICD-10-CM | POA: Diagnosis not present

## 2020-06-22 DIAGNOSIS — M810 Age-related osteoporosis without current pathological fracture: Secondary | ICD-10-CM | POA: Diagnosis not present

## 2020-06-22 DIAGNOSIS — N182 Chronic kidney disease, stage 2 (mild): Secondary | ICD-10-CM | POA: Diagnosis not present

## 2020-06-22 DIAGNOSIS — J45909 Unspecified asthma, uncomplicated: Secondary | ICD-10-CM | POA: Diagnosis not present

## 2020-06-22 DIAGNOSIS — E7800 Pure hypercholesterolemia, unspecified: Secondary | ICD-10-CM | POA: Diagnosis not present

## 2020-06-22 DIAGNOSIS — K219 Gastro-esophageal reflux disease without esophagitis: Secondary | ICD-10-CM | POA: Diagnosis not present

## 2020-06-22 DIAGNOSIS — I10 Essential (primary) hypertension: Secondary | ICD-10-CM | POA: Diagnosis not present

## 2020-08-05 DIAGNOSIS — M810 Age-related osteoporosis without current pathological fracture: Secondary | ICD-10-CM | POA: Diagnosis not present

## 2020-08-05 DIAGNOSIS — N182 Chronic kidney disease, stage 2 (mild): Secondary | ICD-10-CM | POA: Diagnosis not present

## 2020-08-05 DIAGNOSIS — I10 Essential (primary) hypertension: Secondary | ICD-10-CM | POA: Diagnosis not present

## 2020-08-05 DIAGNOSIS — K219 Gastro-esophageal reflux disease without esophagitis: Secondary | ICD-10-CM | POA: Diagnosis not present

## 2020-08-05 DIAGNOSIS — E7800 Pure hypercholesterolemia, unspecified: Secondary | ICD-10-CM | POA: Diagnosis not present

## 2020-08-27 DIAGNOSIS — H60501 Unspecified acute noninfective otitis externa, right ear: Secondary | ICD-10-CM | POA: Diagnosis not present

## 2020-09-05 DIAGNOSIS — B078 Other viral warts: Secondary | ICD-10-CM | POA: Diagnosis not present

## 2020-09-05 DIAGNOSIS — L820 Inflamed seborrheic keratosis: Secondary | ICD-10-CM | POA: Diagnosis not present

## 2020-09-12 DIAGNOSIS — Z961 Presence of intraocular lens: Secondary | ICD-10-CM | POA: Diagnosis not present

## 2020-09-12 DIAGNOSIS — H04123 Dry eye syndrome of bilateral lacrimal glands: Secondary | ICD-10-CM | POA: Diagnosis not present

## 2020-09-12 DIAGNOSIS — H1045 Other chronic allergic conjunctivitis: Secondary | ICD-10-CM | POA: Diagnosis not present

## 2020-09-21 DIAGNOSIS — Z570 Occupational exposure to noise: Secondary | ICD-10-CM | POA: Diagnosis not present

## 2020-09-21 DIAGNOSIS — H903 Sensorineural hearing loss, bilateral: Secondary | ICD-10-CM | POA: Diagnosis not present

## 2020-11-08 DIAGNOSIS — E7800 Pure hypercholesterolemia, unspecified: Secondary | ICD-10-CM | POA: Diagnosis not present

## 2020-11-08 DIAGNOSIS — J45909 Unspecified asthma, uncomplicated: Secondary | ICD-10-CM | POA: Diagnosis not present

## 2020-11-08 DIAGNOSIS — K219 Gastro-esophageal reflux disease without esophagitis: Secondary | ICD-10-CM | POA: Diagnosis not present

## 2020-11-08 DIAGNOSIS — N182 Chronic kidney disease, stage 2 (mild): Secondary | ICD-10-CM | POA: Diagnosis not present

## 2020-11-08 DIAGNOSIS — M810 Age-related osteoporosis without current pathological fracture: Secondary | ICD-10-CM | POA: Diagnosis not present

## 2020-11-08 DIAGNOSIS — I10 Essential (primary) hypertension: Secondary | ICD-10-CM | POA: Diagnosis not present

## 2020-11-12 DIAGNOSIS — R002 Palpitations: Secondary | ICD-10-CM | POA: Diagnosis not present

## 2020-11-12 DIAGNOSIS — Z23 Encounter for immunization: Secondary | ICD-10-CM | POA: Diagnosis not present

## 2020-11-12 DIAGNOSIS — E559 Vitamin D deficiency, unspecified: Secondary | ICD-10-CM | POA: Diagnosis not present

## 2020-11-12 DIAGNOSIS — I872 Venous insufficiency (chronic) (peripheral): Secondary | ICD-10-CM | POA: Diagnosis not present

## 2020-11-12 DIAGNOSIS — I10 Essential (primary) hypertension: Secondary | ICD-10-CM | POA: Diagnosis not present

## 2020-11-12 DIAGNOSIS — M810 Age-related osteoporosis without current pathological fracture: Secondary | ICD-10-CM | POA: Diagnosis not present

## 2020-11-12 DIAGNOSIS — Z0000 Encounter for general adult medical examination without abnormal findings: Secondary | ICD-10-CM | POA: Diagnosis not present

## 2020-11-12 DIAGNOSIS — E7800 Pure hypercholesterolemia, unspecified: Secondary | ICD-10-CM | POA: Diagnosis not present

## 2020-11-12 DIAGNOSIS — N182 Chronic kidney disease, stage 2 (mild): Secondary | ICD-10-CM | POA: Diagnosis not present

## 2020-11-12 DIAGNOSIS — J309 Allergic rhinitis, unspecified: Secondary | ICD-10-CM | POA: Diagnosis not present

## 2020-11-12 DIAGNOSIS — Z1389 Encounter for screening for other disorder: Secondary | ICD-10-CM | POA: Diagnosis not present

## 2020-11-15 ENCOUNTER — Other Ambulatory Visit: Payer: Self-pay | Admitting: Internal Medicine

## 2020-11-15 DIAGNOSIS — M810 Age-related osteoporosis without current pathological fracture: Secondary | ICD-10-CM

## 2020-12-17 DIAGNOSIS — E7800 Pure hypercholesterolemia, unspecified: Secondary | ICD-10-CM | POA: Diagnosis not present

## 2020-12-17 DIAGNOSIS — M810 Age-related osteoporosis without current pathological fracture: Secondary | ICD-10-CM | POA: Diagnosis not present

## 2020-12-17 DIAGNOSIS — K219 Gastro-esophageal reflux disease without esophagitis: Secondary | ICD-10-CM | POA: Diagnosis not present

## 2020-12-17 DIAGNOSIS — I10 Essential (primary) hypertension: Secondary | ICD-10-CM | POA: Diagnosis not present

## 2020-12-17 DIAGNOSIS — N182 Chronic kidney disease, stage 2 (mild): Secondary | ICD-10-CM | POA: Diagnosis not present

## 2020-12-17 DIAGNOSIS — J45909 Unspecified asthma, uncomplicated: Secondary | ICD-10-CM | POA: Diagnosis not present

## 2021-01-15 DIAGNOSIS — R051 Acute cough: Secondary | ICD-10-CM | POA: Diagnosis not present

## 2021-01-15 DIAGNOSIS — Z03818 Encounter for observation for suspected exposure to other biological agents ruled out: Secondary | ICD-10-CM | POA: Diagnosis not present

## 2021-03-24 DIAGNOSIS — R062 Wheezing: Secondary | ICD-10-CM | POA: Diagnosis not present

## 2021-03-24 DIAGNOSIS — J069 Acute upper respiratory infection, unspecified: Secondary | ICD-10-CM | POA: Diagnosis not present

## 2021-05-09 DIAGNOSIS — J45909 Unspecified asthma, uncomplicated: Secondary | ICD-10-CM | POA: Diagnosis not present

## 2021-05-09 DIAGNOSIS — E7800 Pure hypercholesterolemia, unspecified: Secondary | ICD-10-CM | POA: Diagnosis not present

## 2021-05-09 DIAGNOSIS — I10 Essential (primary) hypertension: Secondary | ICD-10-CM | POA: Diagnosis not present

## 2021-05-09 DIAGNOSIS — N182 Chronic kidney disease, stage 2 (mild): Secondary | ICD-10-CM | POA: Diagnosis not present

## 2021-05-09 DIAGNOSIS — I872 Venous insufficiency (chronic) (peripheral): Secondary | ICD-10-CM | POA: Diagnosis not present

## 2021-05-10 ENCOUNTER — Ambulatory Visit
Admission: RE | Admit: 2021-05-10 | Discharge: 2021-05-10 | Disposition: A | Discharge status: Home / Self Care | Payer: Medicare HMO | Source: Ambulatory Visit | Attending: Internal Medicine | Admitting: Internal Medicine

## 2021-05-10 DIAGNOSIS — M810 Age-related osteoporosis without current pathological fracture: Secondary | ICD-10-CM | POA: Diagnosis not present

## 2021-05-10 DIAGNOSIS — Z780 Asymptomatic menopausal state: Secondary | ICD-10-CM | POA: Diagnosis not present

## 2021-05-15 DIAGNOSIS — M810 Age-related osteoporosis without current pathological fracture: Secondary | ICD-10-CM | POA: Diagnosis not present

## 2021-08-09 DIAGNOSIS — J45909 Unspecified asthma, uncomplicated: Secondary | ICD-10-CM | POA: Diagnosis not present

## 2021-08-09 DIAGNOSIS — I499 Cardiac arrhythmia, unspecified: Secondary | ICD-10-CM | POA: Diagnosis not present

## 2021-08-09 DIAGNOSIS — E785 Hyperlipidemia, unspecified: Secondary | ICD-10-CM | POA: Diagnosis not present

## 2021-08-09 DIAGNOSIS — H04129 Dry eye syndrome of unspecified lacrimal gland: Secondary | ICD-10-CM | POA: Diagnosis not present

## 2021-08-09 DIAGNOSIS — Z87891 Personal history of nicotine dependence: Secondary | ICD-10-CM | POA: Diagnosis not present

## 2021-08-09 DIAGNOSIS — I10 Essential (primary) hypertension: Secondary | ICD-10-CM | POA: Diagnosis not present

## 2021-08-09 DIAGNOSIS — I739 Peripheral vascular disease, unspecified: Secondary | ICD-10-CM | POA: Diagnosis not present

## 2021-08-09 DIAGNOSIS — Z809 Family history of malignant neoplasm, unspecified: Secondary | ICD-10-CM | POA: Diagnosis not present

## 2021-09-18 DIAGNOSIS — Z961 Presence of intraocular lens: Secondary | ICD-10-CM | POA: Diagnosis not present

## 2021-09-18 DIAGNOSIS — H43813 Vitreous degeneration, bilateral: Secondary | ICD-10-CM | POA: Diagnosis not present

## 2021-11-14 DIAGNOSIS — J309 Allergic rhinitis, unspecified: Secondary | ICD-10-CM | POA: Diagnosis not present

## 2021-11-14 DIAGNOSIS — E7800 Pure hypercholesterolemia, unspecified: Secondary | ICD-10-CM | POA: Diagnosis not present

## 2021-11-14 DIAGNOSIS — I872 Venous insufficiency (chronic) (peripheral): Secondary | ICD-10-CM | POA: Diagnosis not present

## 2021-11-14 DIAGNOSIS — Z1331 Encounter for screening for depression: Secondary | ICD-10-CM | POA: Diagnosis not present

## 2021-11-14 DIAGNOSIS — N182 Chronic kidney disease, stage 2 (mild): Secondary | ICD-10-CM | POA: Diagnosis not present

## 2021-11-14 DIAGNOSIS — Z23 Encounter for immunization: Secondary | ICD-10-CM | POA: Diagnosis not present

## 2021-11-14 DIAGNOSIS — J45909 Unspecified asthma, uncomplicated: Secondary | ICD-10-CM | POA: Diagnosis not present

## 2021-11-14 DIAGNOSIS — M810 Age-related osteoporosis without current pathological fracture: Secondary | ICD-10-CM | POA: Diagnosis not present

## 2021-11-14 DIAGNOSIS — H9190 Unspecified hearing loss, unspecified ear: Secondary | ICD-10-CM | POA: Diagnosis not present

## 2021-11-14 DIAGNOSIS — M519 Unspecified thoracic, thoracolumbar and lumbosacral intervertebral disc disorder: Secondary | ICD-10-CM | POA: Diagnosis not present

## 2021-11-14 DIAGNOSIS — I10 Essential (primary) hypertension: Secondary | ICD-10-CM | POA: Diagnosis not present

## 2021-11-14 DIAGNOSIS — Z0000 Encounter for general adult medical examination without abnormal findings: Secondary | ICD-10-CM | POA: Diagnosis not present

## 2021-11-14 DIAGNOSIS — E559 Vitamin D deficiency, unspecified: Secondary | ICD-10-CM | POA: Diagnosis not present

## 2022-01-19 DIAGNOSIS — J40 Bronchitis, not specified as acute or chronic: Secondary | ICD-10-CM | POA: Diagnosis not present

## 2022-05-14 DIAGNOSIS — I10 Essential (primary) hypertension: Secondary | ICD-10-CM | POA: Diagnosis not present

## 2022-05-14 DIAGNOSIS — M519 Unspecified thoracic, thoracolumbar and lumbosacral intervertebral disc disorder: Secondary | ICD-10-CM | POA: Diagnosis not present

## 2022-05-14 DIAGNOSIS — E7800 Pure hypercholesterolemia, unspecified: Secondary | ICD-10-CM | POA: Diagnosis not present

## 2022-05-14 DIAGNOSIS — I872 Venous insufficiency (chronic) (peripheral): Secondary | ICD-10-CM | POA: Diagnosis not present

## 2022-05-14 DIAGNOSIS — M810 Age-related osteoporosis without current pathological fracture: Secondary | ICD-10-CM | POA: Diagnosis not present

## 2022-05-14 DIAGNOSIS — N182 Chronic kidney disease, stage 2 (mild): Secondary | ICD-10-CM | POA: Diagnosis not present

## 2022-08-15 DIAGNOSIS — J309 Allergic rhinitis, unspecified: Secondary | ICD-10-CM | POA: Diagnosis not present

## 2022-08-15 DIAGNOSIS — H269 Unspecified cataract: Secondary | ICD-10-CM | POA: Diagnosis not present

## 2022-08-15 DIAGNOSIS — J4489 Other specified chronic obstructive pulmonary disease: Secondary | ICD-10-CM | POA: Diagnosis not present

## 2022-08-15 DIAGNOSIS — G3184 Mild cognitive impairment, so stated: Secondary | ICD-10-CM | POA: Diagnosis not present

## 2022-08-15 DIAGNOSIS — E785 Hyperlipidemia, unspecified: Secondary | ICD-10-CM | POA: Diagnosis not present

## 2022-08-15 DIAGNOSIS — K219 Gastro-esophageal reflux disease without esophagitis: Secondary | ICD-10-CM | POA: Diagnosis not present

## 2022-08-15 DIAGNOSIS — I129 Hypertensive chronic kidney disease with stage 1 through stage 4 chronic kidney disease, or unspecified chronic kidney disease: Secondary | ICD-10-CM | POA: Diagnosis not present

## 2022-08-15 DIAGNOSIS — Z008 Encounter for other general examination: Secondary | ICD-10-CM | POA: Diagnosis not present

## 2022-08-15 DIAGNOSIS — M1990 Unspecified osteoarthritis, unspecified site: Secondary | ICD-10-CM | POA: Diagnosis not present

## 2022-08-15 DIAGNOSIS — N189 Chronic kidney disease, unspecified: Secondary | ICD-10-CM | POA: Diagnosis not present

## 2022-08-15 DIAGNOSIS — M810 Age-related osteoporosis without current pathological fracture: Secondary | ICD-10-CM | POA: Diagnosis not present

## 2022-08-15 DIAGNOSIS — I739 Peripheral vascular disease, unspecified: Secondary | ICD-10-CM | POA: Diagnosis not present

## 2022-08-15 DIAGNOSIS — R32 Unspecified urinary incontinence: Secondary | ICD-10-CM | POA: Diagnosis not present

## 2022-09-25 DIAGNOSIS — H04123 Dry eye syndrome of bilateral lacrimal glands: Secondary | ICD-10-CM | POA: Diagnosis not present

## 2022-09-25 DIAGNOSIS — H43813 Vitreous degeneration, bilateral: Secondary | ICD-10-CM | POA: Diagnosis not present

## 2022-09-25 DIAGNOSIS — Z961 Presence of intraocular lens: Secondary | ICD-10-CM | POA: Diagnosis not present

## 2022-11-06 DIAGNOSIS — Z7689 Persons encountering health services in other specified circumstances: Secondary | ICD-10-CM | POA: Diagnosis not present

## 2022-12-12 DIAGNOSIS — G3184 Mild cognitive impairment, so stated: Secondary | ICD-10-CM | POA: Diagnosis not present

## 2022-12-12 DIAGNOSIS — I10 Essential (primary) hypertension: Secondary | ICD-10-CM | POA: Diagnosis not present

## 2022-12-12 DIAGNOSIS — E7800 Pure hypercholesterolemia, unspecified: Secondary | ICD-10-CM | POA: Diagnosis not present

## 2023-03-14 DIAGNOSIS — Z809 Family history of malignant neoplasm, unspecified: Secondary | ICD-10-CM | POA: Diagnosis not present

## 2023-03-14 DIAGNOSIS — J449 Chronic obstructive pulmonary disease, unspecified: Secondary | ICD-10-CM | POA: Diagnosis not present

## 2023-03-14 DIAGNOSIS — Z8249 Family history of ischemic heart disease and other diseases of the circulatory system: Secondary | ICD-10-CM | POA: Diagnosis not present

## 2023-03-14 DIAGNOSIS — M810 Age-related osteoporosis without current pathological fracture: Secondary | ICD-10-CM | POA: Diagnosis not present

## 2023-03-14 DIAGNOSIS — E785 Hyperlipidemia, unspecified: Secondary | ICD-10-CM | POA: Diagnosis not present

## 2023-03-14 DIAGNOSIS — J309 Allergic rhinitis, unspecified: Secondary | ICD-10-CM | POA: Diagnosis not present

## 2023-03-14 DIAGNOSIS — R32 Unspecified urinary incontinence: Secondary | ICD-10-CM | POA: Diagnosis not present

## 2023-03-14 DIAGNOSIS — Z008 Encounter for other general examination: Secondary | ICD-10-CM | POA: Diagnosis not present

## 2023-03-14 DIAGNOSIS — I10 Essential (primary) hypertension: Secondary | ICD-10-CM | POA: Diagnosis not present

## 2023-03-14 DIAGNOSIS — G3184 Mild cognitive impairment, so stated: Secondary | ICD-10-CM | POA: Diagnosis not present

## 2023-03-14 DIAGNOSIS — M1990 Unspecified osteoarthritis, unspecified site: Secondary | ICD-10-CM | POA: Diagnosis not present

## 2023-03-14 DIAGNOSIS — Z87891 Personal history of nicotine dependence: Secondary | ICD-10-CM | POA: Diagnosis not present

## 2023-06-24 DIAGNOSIS — I10 Essential (primary) hypertension: Secondary | ICD-10-CM | POA: Diagnosis not present

## 2023-06-24 DIAGNOSIS — G309 Alzheimer's disease, unspecified: Secondary | ICD-10-CM | POA: Diagnosis not present

## 2023-06-24 DIAGNOSIS — E7800 Pure hypercholesterolemia, unspecified: Secondary | ICD-10-CM | POA: Diagnosis not present

## 2023-06-24 DIAGNOSIS — J45909 Unspecified asthma, uncomplicated: Secondary | ICD-10-CM | POA: Diagnosis not present

## 2023-08-30 ENCOUNTER — Ambulatory Visit
Admission: EM | Admit: 2023-08-30 | Discharge: 2023-08-30 | Disposition: A | Discharge status: Home / Self Care | Attending: Family Medicine | Admitting: Family Medicine

## 2023-08-30 DIAGNOSIS — J441 Chronic obstructive pulmonary disease with (acute) exacerbation: Secondary | ICD-10-CM | POA: Diagnosis not present

## 2023-08-30 DIAGNOSIS — J069 Acute upper respiratory infection, unspecified: Secondary | ICD-10-CM | POA: Diagnosis not present

## 2023-08-30 MED ORDER — ALBUTEROL SULFATE HFA 108 (90 BASE) MCG/ACT IN AERS: 2.0000 | INHALATION_SPRAY | RESPIRATORY_TRACT | 0 refills | Status: AC | PRN

## 2023-08-30 MED ORDER — AZELASTINE HCL 0.1 % NA SOLN: 1.0000 | Freq: Two times a day (BID) | NASAL | 0 refills | Status: DC

## 2023-08-30 MED ORDER — BENZONATATE 200 MG PO CAPS: 200.0000 mg | ORAL_CAPSULE | Freq: Three times a day (TID) | ORAL | 0 refills | Status: DC | PRN

## 2023-08-30 NOTE — Discharge Instructions (Signed)
 In addition to the prescribed medications, you may take Coricidin HBP, plain Mucinex, saline sinus rinses and use humidifiers and vapor rubs.  Follow-up for worsening or unresolving symptoms.

## 2023-08-30 NOTE — ED Triage Notes (Signed)
 Pt presents to UC for c/o cough x 3 days. Some productive coughing. Some shortness of breath. Took albuterol  inhaler, tylenol

## 2023-09-01 NOTE — ED Provider Notes (Signed)
 [facility]    CSN: [id number] Arrival date & time: 08/30/23  1548      History   Chief Complaint No chief complaint on file.   HPI Tiffany Bradshaw is a 89 y.o. female.   Presenting today with daughter for evaluation of 3 day history of congestion, cough, mild SOB worse yesterday than today. Denies fever, chills, body aches, CP, abdominal pain, N/V/D. So far trying tylenol, and found an old albuterol  inhaler in her drawer last night and tried that which she states seemed to have helped. Per chart review hx of COPD though patient denies having this. On albuterol  prn for this but thinks her inhaler she found may be expired.     Past Medical History:  Diagnosis Date   Hypertension    Otitis     There are no active problems to display for this patient.   Past Surgical History:  Procedure Laterality Date   SHOULDER SURGERY     WRIST SURGERY      OB History   No obstetric history on file.      Home Medications    Prior to Admission medications   Medication Sig Start Date End Date Taking? Authorizing Provider  albuterol  (VENTOLIN  HFA) 108 (90 Base) MCG/ACT inhaler Inhale 2 puffs into the lungs every 4 (four) hours as needed. 08/30/23  Yes Stuart Vernell Norris, PA-C  Atorvastatin Calcium (LIPITOR PO) Take by mouth.   Yes [provider]  azelastine  (ASTELIN ) 0.1 % nasal spray Place 1 spray into both nostrils 2 (two) times daily. Use in each nostril as directed 08/30/23  Yes Stuart Vernell Norris, PA-C  benzonatate  (TESSALON ) 200 MG capsule Take 1 capsule (200 mg total) by mouth 3 (three) times daily as needed for cough. 08/30/23  Yes Stuart Vernell Norris, PA-C  HYDROCHLOROTHIAZIDE PO Take by mouth.   Yes [provider]  Metoprolol Succinate (TOPROL XL PO) Take by mouth.   Yes [provider]  amoxicillin -clavulanate (AUGMENTIN ) 875-125 MG per tablet Take 1 tablet by mouth every 12 (twelve) hours. 07/07/14   Keith Sor, PA-C     Family History History reviewed. No pertinent family history.  Social History Social History   Tobacco Use   Smoking status: Former    Types: Cigarettes   Smokeless tobacco: Never  Substance Use Topics   Alcohol use: No   Drug use: No     Allergies   Demerol [meperidine]   Review of Systems Review of Systems PER HPI  Physical Exam Triage Vital Signs ED Triage Vitals  Encounter Vitals Group     BP 08/30/23 1556 128/67     Girls Systolic BP Percentile --      Girls Diastolic BP Percentile --      Boys Systolic BP Percentile --      Boys Diastolic BP Percentile --      Pulse Rate 08/30/23 1556 74     Resp 08/30/23 1556 18     Temp 08/30/23 1556 97.7 F (36.5 C)     Temp Source 08/30/23 1556 Oral     SpO2 08/30/23 1556 95 %     Weight --      Height --      Head Circumference --      Peak Flow --      Pain Score 08/30/23 1555 0     Pain Loc --      Pain Education --      Exclude from Growth Chart --  No data found.  Updated Vital Signs BP 128/67 (BP Location: Right Arm)   Pulse 74   Temp 97.7 F (36.5 C) (Oral)   Resp 18   SpO2 95%   Visual Acuity Right Eye Distance:   Left Eye Distance:   Bilateral Distance:    Right Eye Near:   Left Eye Near:    Bilateral Near:     Physical Exam Vitals and nursing note reviewed.  Constitutional:      Appearance: Normal appearance.  HENT:     Head: Atraumatic.     Right Ear: Tympanic membrane and external ear normal.     Left Ear: Tympanic membrane and external ear normal.     Nose: Rhinorrhea present.     Mouth/Throat:     Mouth: Mucous membranes are moist.     Pharynx: No posterior oropharyngeal erythema.  Eyes:     Extraocular Movements: Extraocular movements intact.     Conjunctiva/sclera: Conjunctivae normal.  Cardiovascular:     Rate and Rhythm: Normal rate and regular rhythm.     Heart sounds: Normal heart sounds.  Pulmonary:     Effort: Pulmonary effort is normal.     Breath sounds:  Normal breath sounds. No wheezing or rales.  Musculoskeletal:        General: Normal range of motion.     Cervical back: Normal range of motion and neck supple.  Skin:    General: Skin is warm and dry.  Neurological:     Mental Status: She is alert. Mental status is at baseline.  Psychiatric:        Mood and Affect: Mood normal.        Thought Content: Thought content normal.      UC Treatments / Results  Labs (all labs ordered are listed, but only abnormal results are displayed) Labs Reviewed - No data to display  EKG   Radiology No results found.  Procedures Procedures (including critical care time)  Medications Ordered in UC Medications - No data to display  Initial Impression / Assessment and Plan / UC Course  I have reviewed the triage vital signs and the nursing notes.  Pertinent labs & imaging results that were available during my care of the patient were reviewed by me and considered in my medical decision making (see chart for details).     Vitals and exam very reassuring, she is well appearing and in no acute distress and speaking in full sentences. Suspect viral resp infection causing COPD exacerbation. Will treat with albuterol  prn, mucinex, tessalon , astelin , coricidin HBP and supportive home care. Return for worsening sxs.  Final Clinical Impressions(s) / UC Diagnoses   Final diagnoses:  Viral URI with cough  COPD exacerbation (HCC)     Discharge Instructions      In addition to the prescribed medications, you may take Coricidin HBP, plain Mucinex, saline sinus rinses and use humidifiers and vapor rubs.  Follow-up for worsening or unresolving symptoms.    ED Prescriptions     Medication Sig Dispense Auth. Provider   albuterol  (VENTOLIN  HFA) 108 (90 Base) MCG/ACT inhaler Inhale 2 puffs into the lungs every 4 (four) hours as needed. 18 g Stuart Vernell Norris, PA-C   benzonatate  (TESSALON ) 200 MG capsule Take 1 capsule (200 mg total) by mouth 3  (three) times daily as needed for cough. 20 capsule Stuart Vernell Norris, PA-C   azelastine  (ASTELIN ) 0.1 % nasal spray Place 1 spray into both nostrils 2 (two) times daily. Use  in each nostril as directed 30 mL Stuart Vernell Norris, PA-C      PDMP not reviewed this encounter.   Stuart Vernell Norris, [place] 09/01/23 2056

## 2023-09-07 DIAGNOSIS — J40 Bronchitis, not specified as acute or chronic: Secondary | ICD-10-CM | POA: Diagnosis not present

## 2023-09-07 DIAGNOSIS — R21 Rash and other nonspecific skin eruption: Secondary | ICD-10-CM | POA: Diagnosis not present

## 2023-09-07 DIAGNOSIS — J45909 Unspecified asthma, uncomplicated: Secondary | ICD-10-CM | POA: Diagnosis not present

## 2023-09-10 DIAGNOSIS — R21 Rash and other nonspecific skin eruption: Secondary | ICD-10-CM | POA: Diagnosis not present

## 2023-09-28 DIAGNOSIS — J45909 Unspecified asthma, uncomplicated: Secondary | ICD-10-CM | POA: Diagnosis not present

## 2023-09-28 DIAGNOSIS — J40 Bronchitis, not specified as acute or chronic: Secondary | ICD-10-CM | POA: Diagnosis not present

## 2023-09-28 DIAGNOSIS — G309 Alzheimer's disease, unspecified: Secondary | ICD-10-CM | POA: Diagnosis not present

## 2023-09-28 DIAGNOSIS — E7800 Pure hypercholesterolemia, unspecified: Secondary | ICD-10-CM | POA: Diagnosis not present

## 2023-09-28 DIAGNOSIS — I10 Essential (primary) hypertension: Secondary | ICD-10-CM | POA: Diagnosis not present

## 2023-09-28 DIAGNOSIS — J019 Acute sinusitis, unspecified: Secondary | ICD-10-CM | POA: Diagnosis not present

## 2023-09-28 DIAGNOSIS — N182 Chronic kidney disease, stage 2 (mild): Secondary | ICD-10-CM | POA: Diagnosis not present

## 2023-10-15 DIAGNOSIS — L03115 Cellulitis of right lower limb: Secondary | ICD-10-CM | POA: Diagnosis not present

## 2023-10-17 DIAGNOSIS — L03115 Cellulitis of right lower limb: Secondary | ICD-10-CM | POA: Diagnosis not present

## 2023-10-19 ENCOUNTER — Encounter (HOSPITAL_COMMUNITY): Payer: Self-pay | Admitting: Emergency Medicine

## 2023-10-19 ENCOUNTER — Other Ambulatory Visit: Payer: Self-pay

## 2023-10-19 ENCOUNTER — Inpatient Hospital Stay (HOSPITAL_COMMUNITY)
Admission: EM | Admit: 2023-10-19 | Discharge: 2023-10-23 | DRG: 603 | Disposition: A | Discharge status: Home / Self Care | Attending: Internal Medicine | Admitting: Internal Medicine

## 2023-10-19 DIAGNOSIS — I129 Hypertensive chronic kidney disease with stage 1 through stage 4 chronic kidney disease, or unspecified chronic kidney disease: Secondary | ICD-10-CM | POA: Diagnosis present

## 2023-10-19 DIAGNOSIS — F0280 Dementia in other diseases classified elsewhere without behavioral disturbance: Secondary | ICD-10-CM | POA: Diagnosis not present

## 2023-10-19 DIAGNOSIS — Z79899 Other long term (current) drug therapy: Secondary | ICD-10-CM | POA: Diagnosis not present

## 2023-10-19 DIAGNOSIS — E876 Hypokalemia: Secondary | ICD-10-CM | POA: Diagnosis not present

## 2023-10-19 DIAGNOSIS — Z881 Allergy status to other antibiotic agents status: Secondary | ICD-10-CM | POA: Diagnosis not present

## 2023-10-19 DIAGNOSIS — E663 Overweight: Secondary | ICD-10-CM | POA: Diagnosis not present

## 2023-10-19 DIAGNOSIS — Z888 Allergy status to other drugs, medicaments and biological substances status: Secondary | ICD-10-CM | POA: Diagnosis not present

## 2023-10-19 DIAGNOSIS — I10 Essential (primary) hypertension: Secondary | ICD-10-CM | POA: Diagnosis present

## 2023-10-19 DIAGNOSIS — E785 Hyperlipidemia, unspecified: Secondary | ICD-10-CM | POA: Diagnosis not present

## 2023-10-19 DIAGNOSIS — Z87891 Personal history of nicotine dependence: Secondary | ICD-10-CM

## 2023-10-19 DIAGNOSIS — L03115 Cellulitis of right lower limb: Principal | ICD-10-CM | POA: Diagnosis present

## 2023-10-19 DIAGNOSIS — Z880 Allergy status to penicillin: Secondary | ICD-10-CM

## 2023-10-19 DIAGNOSIS — D649 Anemia, unspecified: Secondary | ICD-10-CM | POA: Diagnosis not present

## 2023-10-19 DIAGNOSIS — M79661 Pain in right lower leg: Secondary | ICD-10-CM | POA: Diagnosis not present

## 2023-10-19 DIAGNOSIS — J45909 Unspecified asthma, uncomplicated: Secondary | ICD-10-CM | POA: Diagnosis present

## 2023-10-19 DIAGNOSIS — Z6825 Body mass index (BMI) 25.0-25.9, adult: Secondary | ICD-10-CM

## 2023-10-19 DIAGNOSIS — G309 Alzheimer's disease, unspecified: Secondary | ICD-10-CM | POA: Diagnosis not present

## 2023-10-19 DIAGNOSIS — L0390 Cellulitis, unspecified: Principal | ICD-10-CM

## 2023-10-19 DIAGNOSIS — R609 Edema, unspecified: Secondary | ICD-10-CM | POA: Diagnosis present

## 2023-10-19 DIAGNOSIS — M7989 Other specified soft tissue disorders: Secondary | ICD-10-CM | POA: Diagnosis present

## 2023-10-19 DIAGNOSIS — N182 Chronic kidney disease, stage 2 (mild): Secondary | ICD-10-CM | POA: Diagnosis not present

## 2023-10-19 LAB — CBC WITH DIFFERENTIAL/PLATELET
Abs Immature Granulocytes: 0.04 K/uL (ref 0.00–0.07)
Basophils Absolute: 0 K/uL (ref 0.0–0.1)
Basophils Relative: 1 %
Eosinophils Absolute: 0.1 K/uL (ref 0.0–0.5)
Eosinophils Relative: 2 %
HCT: 44.6 % (ref 36.0–46.0)
Hemoglobin: 14.6 g/dL (ref 12.0–15.0)
Immature Granulocytes: 1 %
Lymphocytes Relative: 15 %
Lymphs Abs: 0.6 K/uL — ABNORMAL LOW (ref 0.7–4.0)
MCH: 30.5 pg (ref 26.0–34.0)
MCHC: 32.7 g/dL (ref 30.0–36.0)
MCV: 93.3 fL (ref 80.0–100.0)
Monocytes Absolute: 0.7 K/uL (ref 0.1–1.0)
Monocytes Relative: 17 %
Neutro Abs: 2.5 K/uL (ref 1.7–7.7)
Neutrophils Relative %: 64 %
Platelets: 243 K/uL (ref 150–400)
RBC: 4.78 MIL/uL (ref 3.87–5.11)
RDW: 13.9 % (ref 11.5–15.5)
WBC: 3.9 K/uL — ABNORMAL LOW (ref 4.0–10.5)
nRBC: 0 % (ref 0.0–0.2)

## 2023-10-19 LAB — COMPREHENSIVE METABOLIC PANEL WITH GFR
ALT: 21 U/L (ref 0–44)
AST: 32 U/L (ref 15–41)
Albumin: 4.2 g/dL (ref 3.5–5.0)
Alkaline Phosphatase: 77 U/L (ref 38–126)
Anion gap: 15 (ref 5–15)
BUN: 17 mg/dL (ref 8–23)
CO2: 21 mmol/L — ABNORMAL LOW (ref 22–32)
Calcium: 10.1 mg/dL (ref 8.9–10.3)
Chloride: 98 mmol/L (ref 98–111)
Creatinine, Ser: 1.13 mg/dL — ABNORMAL HIGH (ref 0.44–1.00)
GFR, Estimated: 46 mL/min — ABNORMAL LOW (ref >60)
Glucose, Bld: 79 mg/dL (ref 70–99)
Potassium: 3.4 mmol/L — ABNORMAL LOW (ref 3.5–5.1)
Sodium: 134 mmol/L — ABNORMAL LOW (ref 135–145)
Total Bilirubin: 0.6 mg/dL (ref 0.0–1.2)
Total Protein: 7.6 g/dL (ref 6.5–8.1)

## 2023-10-19 LAB — I-STAT CG4 LACTIC ACID, ED: Lactic Acid, Venous: 0.7 mmol/L (ref 0.5–1.9)

## 2023-10-19 MED ORDER — SODIUM CHLORIDE 0.9 % IV SOLN
2.0000 g | Freq: Once | INTRAVENOUS | Status: AC
  Administered 2023-10-19: 2 g via INTRAVENOUS
  Filled 2023-10-19: qty 12.5

## 2023-10-19 MED ORDER — VANCOMYCIN HCL 1250 MG/250ML IV SOLN
1250.0000 mg | INTRAVENOUS | Status: DC
Start: 2023-10-21 — End: 2023-10-27

## 2023-10-19 MED ORDER — ATORVASTATIN CALCIUM 10 MG PO TABS
10.0000 mg | ORAL_TABLET | Freq: Every day | ORAL | Status: DC
  Administered 2023-10-20 – 2023-10-23 (×4): 10 mg via ORAL
  Filled 2023-10-19 (×5): qty 1

## 2023-10-19 MED ORDER — PROCHLORPERAZINE EDISYLATE 10 MG/2ML IJ SOLN: 5.0000 mg | Freq: Four times a day (QID) | INTRAMUSCULAR | Status: DC | PRN

## 2023-10-19 MED ORDER — ACETAMINOPHEN 650 MG RE SUPP: 650.0000 mg | Freq: Four times a day (QID) | RECTAL | Status: DC | PRN

## 2023-10-19 MED ORDER — VANCOMYCIN HCL IN DEXTROSE 1-5 GM/200ML-% IV SOLN
1000.0000 mg | Freq: Once | INTRAVENOUS | Status: AC
  Administered 2023-10-19: 1000 mg via INTRAVENOUS
  Filled 2023-10-19: qty 200

## 2023-10-19 MED ORDER — METOPROLOL SUCCINATE ER 25 MG PO TB24
25.0000 mg | ORAL_TABLET | Freq: Every day | ORAL | Status: DC
  Administered 2023-10-20 – 2023-10-23 (×3): 25 mg via ORAL
  Filled 2023-10-19 (×4): qty 1

## 2023-10-19 MED ORDER — ACETAMINOPHEN 325 MG PO TABS
650.0000 mg | ORAL_TABLET | Freq: Four times a day (QID) | ORAL | Status: DC | PRN
  Administered 2023-10-20 (×2): 650 mg via ORAL
  Filled 2023-10-19 (×2): qty 2

## 2023-10-19 MED ORDER — SODIUM CHLORIDE 0.9 % IV BOLUS
500.0000 mL | Freq: Once | INTRAVENOUS | Status: AC
  Administered 2023-10-19: 500 mL via INTRAVENOUS

## 2023-10-19 MED ORDER — ENOXAPARIN SODIUM 40 MG/0.4ML IJ SOSY
40.0000 mg | PREFILLED_SYRINGE | Freq: Every day | INTRAMUSCULAR | Status: DC
  Administered 2023-10-19 – 2023-10-20 (×2): 40 mg via SUBCUTANEOUS
  Filled 2023-10-19 (×2): qty 0.4

## 2023-10-19 MED ORDER — POTASSIUM CHLORIDE CRYS ER 20 MEQ PO TBCR
20.0000 meq | EXTENDED_RELEASE_TABLET | Freq: Once | ORAL | Status: AC
  Administered 2023-10-19: 20 meq via ORAL
  Filled 2023-10-19: qty 1

## 2023-10-19 MED ORDER — OXYCODONE HCL 5 MG PO TABS: 2.5000 mg | ORAL_TABLET | ORAL | Status: DC | PRN

## 2023-10-19 MED ORDER — POLYETHYLENE GLYCOL 3350 17 G PO PACK: 17.0000 g | PACK | Freq: Every day | ORAL | Status: DC | PRN

## 2023-10-19 MED ORDER — ALBUTEROL SULFATE (2.5 MG/3ML) 0.083% IN NEBU: 2.5000 mg | INHALATION_SOLUTION | Freq: Four times a day (QID) | RESPIRATORY_TRACT | Status: DC | PRN

## 2023-10-19 MED ORDER — SODIUM CHLORIDE 0.9 % IV SOLN
1.0000 g | Freq: Every day | INTRAVENOUS | Status: DC
  Administered 2023-10-20 – 2023-10-22 (×3): 1 g via INTRAVENOUS
  Filled 2023-10-19 (×3): qty 10

## 2023-10-19 NOTE — Progress Notes (Signed)
 A consult was received from an ED physician for vancomycin and cefepime per pharmacy dosing (for an indication other than meningitis). The patient's profile has been reviewed for ht/wt/allergies/indication/available labs. A one time order has been placed for the above antibiotics.  Further antibiotics/pharmacy consults should be ordered by admitting physician if indicated.                       Bard Jeans, PharmD, BCPS [phone] 10/19/2023, 7:11 PM

## 2023-10-19 NOTE — ED Provider Notes (Addendum)
 [place] EMERGENCY DEPARTMENT AT [facility] Provider Note   CSN: [id number] Arrival date & time: 10/19/23  1720     Patient presents with: Leg Swelling   Tiffany Bradshaw is a 89 y.o. female.   Patient here with ongoing concern for cellulitis to her right lower leg.  She was on a course of Bactrim with no improvement and then put on ciprofloxacin in the last couple days with not much improvement as well.  She has allergies to doxycycline .  Seems like she had a rash recently to Augmentin  as well.  Patient denies any history of diabetes.  She denies any fever or chills.  Overall sent here by primary care for IV antibiotics.  She has not had any major clinical improvement.  She is having some discomfort in the right lower leg as well.  Denies any recent surgery or travel.  The history is provided by the patient.       Prior to Admission medications   Medication Sig Start Date End Date Taking? Authorizing Provider  albuterol  (VENTOLIN  HFA) 108 (90 Base) MCG/ACT inhaler Inhale 2 puffs into the lungs every 4 (four) hours as needed. 08/30/23   Stuart Vernell Norris, PA-C  amoxicillin -clavulanate (AUGMENTIN ) 875-125 MG per tablet Take 1 tablet by mouth every 12 (twelve) hours. 07/07/14   Keith Sor, PA-C  Atorvastatin Calcium (LIPITOR PO) Take by mouth.    [provider]  azelastine  (ASTELIN ) 0.1 % nasal spray Place 1 spray into both nostrils 2 (two) times daily. Use in each nostril as directed 08/30/23   Stuart Vernell Norris, PA-C  benzonatate  (TESSALON ) 200 MG capsule Take 1 capsule (200 mg total) by mouth 3 (three) times daily as needed for cough. 08/30/23   Stuart Vernell Norris, PA-C  HYDROCHLOROTHIAZIDE PO Take by mouth.    [provider]  Metoprolol Succinate (TOPROL XL PO) Take by mouth.    [provider]    Allergies: Demerol [meperidine]    Review of Systems  Updated Vital Signs BP (!) 151/77   Pulse 67   Temp 98 F (36.7 C)    Resp 16   SpO2 100%   Physical Exam Vitals and nursing note reviewed.  Constitutional:      General: She is not in acute distress.    Appearance: She is well-developed. She is not ill-appearing.  HENT:     Head: Normocephalic and atraumatic.  Eyes:     Extraocular Movements: Extraocular movements intact.     Conjunctiva/sclera: Conjunctivae normal.     Pupils: Pupils are equal, round, and reactive to light.  Cardiovascular:     Rate and Rhythm: Normal rate and regular rhythm.     Pulses: Normal pulses.     Heart sounds: Normal heart sounds. No murmur heard. Pulmonary:     Effort: Pulmonary effort is normal. No respiratory distress.     Breath sounds: Normal breath sounds.  Abdominal:     Palpations: Abdomen is soft.     Tenderness: There is no abdominal tenderness.  Musculoskeletal:        General: Swelling present.     Cervical back: Normal range of motion and neck supple.     Comments: Significant redness and swelling to the right lower extremity consistent with a cellulitis that involves all of her foot up to just below her knee  Skin:    General: Skin is warm and dry.     Capillary Refill: Capillary refill takes less than 2 seconds.  Neurological:     General: No focal deficit present.     Mental Status: She is alert.  Psychiatric:        Mood and Affect: Mood normal.     (all labs ordered are listed, but only abnormal results are displayed) Labs Reviewed  COMPREHENSIVE METABOLIC PANEL WITH GFR - Abnormal; Notable for the following components:      Result Value   Sodium 134 (*)    Potassium 3.4 (*)    CO2 21 (*)    Creatinine, Ser 1.13 (*)    GFR, Estimated 46 (*)    All other components within normal limits  CBC WITH DIFFERENTIAL/PLATELET - Abnormal; Notable for the following components:   WBC 3.9 (*)    Lymphs Abs 0.6 (*)    All other components within normal limits  CULTURE, BLOOD (ROUTINE X 2)  CULTURE, BLOOD (ROUTINE X 2)  URINALYSIS, W/ REFLEX TO  CULTURE (INFECTION SUSPECTED)  I-STAT CG4 LACTIC ACID, ED    EKG: None  Radiology: No results found.   Procedures   Medications Ordered in the ED - No data to display                                  Medical Decision Making Amount and/or Complexity of Data Reviewed Labs: ordered.  Risk Decision regarding hospitalization.   Tiffany Bradshaw is here with ongoing cellulitis of her right lower leg.  Redness and swelling from just below the knee including the right foot.  She has been on Bactrim for course of treatment and now ciprofloxacin for several days with no real improvement.  She denies any fever or chills.  Multiple drug allergies including Augmentin  and doxycycline .  I have lower suspicion for DVT but unable to get a DVT study at this time.  Does look cellulitic in nature.  Will get basic labs and talk with medicine about observation stay for some IV antibiotics.  White count is 3.9.  Lab work is otherwise unremarkable.  Creatinine is mildly elevated at 1.13.  Not have any concern for sepsis.  Overall we will admit to hospitalist for further IV antibiotics for failed outpatient cellulitis.  Unable to get DVT study at this time.  Overall broad-spectrum IV antibiotics started.  Patient admitted to hospital service for further care.   This chart was dictated using voice recognition software.  Despite best efforts to proofread,  errors can occur which can change the documentation meaning.      Final diagnoses:  Cellulitis, unspecified cellulitis site    ED Discharge Orders     None          Ruthe Cornet, DO 10/19/23 1907    Ruthe Cornet, DO 10/19/23 1908

## 2023-10-19 NOTE — Progress Notes (Signed)
 Pharmacy Antibiotic Note  Tiffany Bradshaw is a 89 y.o. female admitted on 10/19/2023 with cellulitis.  Pharmacy has been consulted for vancomycin dosing for 7 days total.  In ED, vancomycin 1 g IV x 1 ordered  Plan: Continue vancomycin at 1250 mg IV every 36 hours (Goal AUC 400-550, eAUC 483.5, SCr used; 1.13) Monitor clinical progress, renal function, vancomycin levels as indicated F/U C&S, abx deescalation / LOT   Height: 5' 8 (172.7 cm) Weight: 73.3 kg (161 lb 9.6 oz) IBW/kg (Calculated) : 63.9  Temp (24hrs), Avg:98 F (36.7 C), Min:98 F (36.7 C), Max:98 F (36.7 C)  Recent Labs  Lab 10/19/23 1757 10/19/23 2010  WBC 3.9*  --   CREATININE 1.13*  --   LATICACIDVEN  --  0.7    Estimated Creatinine Clearance: 34 mL/min (A) (by C-G formula based on SCr of 1.13 mg/dL (H)).    Allergies  Allergen Reactions   Demerol [Meperidine]    Doxycycline     Augmentin  [Amoxicillin -Pot Clavulanate] Rash    Antimicrobials this admission: 9/29 cefepime x 1 9/29 Rocephin >>  9/29 vancomycin >> (10/5)   Microbiology results: 9/29 BCx: collected   Thank you for allowing pharmacy to be a part of this patient's care.  Eleanor Agent, PharmD, BCPS 10/19/2023 8:57 PM

## 2023-10-19 NOTE — H&P (Signed)
 History and Physical    Tiffany Bradshaw FMW:[id number] DOB: 11/06/1932 DOA: 10/19/2023  PCP: Ransom Other, MD   Patient coming from: Home   Chief Complaint: Lower right leg pain, swelling, and redness   HPI: Tiffany Bradshaw is a 89 y.o. female with medical history significant for hypertension, hyperlipidemia, CKD stage II, Alzheimer dementia, and asthma who presents with redness, pain, and swelling involving the lower right leg despite outpatient antibiotics.  Patient was complaining of pain and swelling in her lower right leg 1 week ago, was seen at a walk-in clinic where she was diagnosed with cellulitis and prescribed Bactrim, continued to worsen and was seen again on 10/17/2023 and was given ciprofloxacin at that time.  She was seen again today in follow-up, noted to be worsening despite outpatient treatment, and was directed to the ED.  Patient denies any other active skin issues, fever, chills, chest pain, cough, or shortness of breath.  ED Course: Upon arrival to the ED, patient is found to be afebrile and saturating well on room air with normal HR and stable BP.  Labs are most notable for creatinine 1.13 and WBC 3900.  Patient was treated in the ED with cefepime and vancomycin.  Review of Systems:  All other systems reviewed and apart from HPI, are negative.  Past Medical History:  Diagnosis Date   Alzheimer dementia (HCC) 10/19/2023   Asthma, chronic 10/19/2023   CKD (chronic kidney disease) stage 2, GFR 60-89 ml/min 10/19/2023   Hyperlipidemia 10/19/2023   Hypertension    Otitis     Past Surgical History:  Procedure Laterality Date   SHOULDER SURGERY     WRIST SURGERY      Social History:   reports that she has quit smoking. Her smoking use included cigarettes. She has never used smokeless tobacco. She reports that she does not drink alcohol and does not use drugs.  Allergies  Allergen Reactions   Demerol [Meperidine]    Doxycycline     Augmentin   [Amoxicillin -Pot Clavulanate] Rash    History reviewed. No pertinent family history.   Prior to Admission medications   Medication Sig Start Date End Date Taking? Authorizing Provider  albuterol  (VENTOLIN  HFA) 108 (90 Base) MCG/ACT inhaler Inhale 2 puffs into the lungs every 4 (four) hours as needed. 08/30/23   Stuart Vernell Norris, PA-C  amoxicillin -clavulanate (AUGMENTIN ) 875-125 MG per tablet Take 1 tablet by mouth every 12 (twelve) hours. 07/07/14   Keith Sor, PA-C  Atorvastatin Calcium (LIPITOR PO) Take by mouth.    [provider]  azelastine  (ASTELIN ) 0.1 % nasal spray Place 1 spray into both nostrils 2 (two) times daily. Use in each nostril as directed 08/30/23   Stuart Vernell Norris, PA-C  benzonatate  (TESSALON ) 200 MG capsule Take 1 capsule (200 mg total) by mouth 3 (three) times daily as needed for cough. 08/30/23   Stuart Vernell Norris, PA-C  HYDROCHLOROTHIAZIDE PO Take by mouth.    [provider]  Metoprolol Succinate (TOPROL XL PO) Take by mouth.    [provider]    Physical Exam: Vitals:   10/19/23 1743  BP: (!) 151/77  Pulse: 67  Resp: 16  Temp: 98 F (36.7 C)  SpO2: 100%    Constitutional: NAD, no pallor or diaphoresis   Eyes: PERTLA, lids and conjunctivae normal ENMT: Mucous membranes are moist. Posterior pharynx clear of any exudate or lesions.   Neck: supple, no masses  Respiratory: no wheezing, no crackles. No accessory muscle use.  Cardiovascular: S1 &  S2 heard, regular rate and rhythm. No JVD. Abdomen: No tenderness, soft. Bowel sounds active.  Musculoskeletal: no clubbing / cyanosis. No joint deformity upper and lower extremities.   Skin: Erythema, heat, tenderness, and edema involving lower RLE. Skin is otherwise warm, dry, well-perfused. Neurologic: CN 2-12 grossly intact. Moving all extremities. Alert and oriented to person, place, and situation.  Psychiatric: Calm. Cooperative.    Labs and Imaging on Admission: I  have personally reviewed following labs and imaging studies  CBC: Recent Labs  Lab 10/19/23 1757  WBC 3.9*  NEUTROABS 2.5  HGB 14.6  HCT 44.6  MCV 93.3  PLT 243   Basic Metabolic Panel: Recent Labs  Lab 10/19/23 1757  NA 134*  K 3.4*  CL 98  CO2 21*  GLUCOSE 79  BUN 17  CREATININE 1.13*  CALCIUM 10.1   GFR: CrCl cannot be calculated (Unknown ideal weight.). Liver Function Tests: Recent Labs  Lab 10/19/23 1757  AST 32  ALT 21  ALKPHOS 77  BILITOT 0.6  PROT 7.6  ALBUMIN 4.2   No results for input(s): LIPASE, AMYLASE in the last 168 hours. No results for input(s): AMMONIA in the last 168 hours. Coagulation Profile: No results for input(s): INR, PROTIME in the last 168 hours. Cardiac Enzymes: No results for input(s): CKTOTAL, CKMB, CKMBINDEX, TROPONINI in the last 168 hours. BNP (last 3 results) No results for input(s): PROBNP in the last 8760 hours. HbA1C: No results for input(s): HGBA1C in the last 72 hours. CBG: No results for input(s): GLUCAP in the last 168 hours. Lipid Profile: No results for input(s): CHOL, HDL, LDLCALC, TRIG, CHOLHDL, LDLDIRECT in the last 72 hours. Thyroid  Function Tests: No results for input(s): TSH, T4TOTAL, FREET4, T3FREE, THYROIDAB in the last 72 hours. Anemia Panel: No results for input(s): VITAMINB12, FOLATE, FERRITIN, TIBC, IRON, RETICCTPCT in the last 72 hours. Urine analysis: No results found for: COLORURINE, APPEARANCEUR, LABSPEC, PHURINE, GLUCOSEU, HGBUR, BILIRUBINUR, KETONESUR, PROTEINUR, UROBILINOGEN, NITRITE, LEUKOCYTESUR Sepsis Labs: @LABRCNTIP (procalcitonin:4,lacticidven:4) )No results found for this or any previous visit (from the past 240 hours).   Radiological Exams on Admission: No results found.   Assessment/Plan   1. Cellulitis involving lower right leg  - Worsening despite outpatient treatment  - Continue broad-spectrum  antibiotics, obtain advanced imaging if worsens or fails to improve with IV antibiotics as expected    2. CKD 2  - SCr is 1.13, up from 0.83 in November 2024  - Hold hydrochlorothiazide, monitor    3. Hypertension  - Hold hydrochlorothiazide in light of increased creatinine, continue metoprolol    4. HLD  - Lipitor    5. Asthma  - Not in exacerbation  - Albuterol  as-needed   6. Dementia  - Delirium precautions     DVT prophylaxis: Lovenox  Code Status: Full  Level of Care: Level of care: Med-Surg Family Communication: daughter at bedside  Disposition Plan:  Patient is from: Home  Anticipated d/c is to: home  Anticipated d/c date is: 10/21/23  Patient currently: Pending clinical improvement  Consults called: None  Admission status: Inpatient     Evalene GORMAN Sprinkles, MD [facility]  10/19/2023, 7:26 PM

## 2023-10-19 NOTE — ED Triage Notes (Signed)
 Patient c/o leg swelling  x 1 week. Patient report worsening redness and swelling today. Family report oral antibiotic that was prescribe last Thursday was not working. Patient denies N/V. Patient denies fever.

## 2023-10-20 ENCOUNTER — Inpatient Hospital Stay (HOSPITAL_COMMUNITY)

## 2023-10-20 DIAGNOSIS — G309 Alzheimer's disease, unspecified: Secondary | ICD-10-CM | POA: Diagnosis not present

## 2023-10-20 DIAGNOSIS — I10 Essential (primary) hypertension: Secondary | ICD-10-CM | POA: Diagnosis not present

## 2023-10-20 DIAGNOSIS — E876 Hypokalemia: Secondary | ICD-10-CM | POA: Diagnosis not present

## 2023-10-20 DIAGNOSIS — L03115 Cellulitis of right lower limb: Secondary | ICD-10-CM | POA: Diagnosis not present

## 2023-10-20 LAB — CBC
HCT: 37.5 % (ref 36.0–46.0)
Hemoglobin: 12.1 g/dL (ref 12.0–15.0)
MCH: 29.5 pg (ref 26.0–34.0)
MCHC: 32.3 g/dL (ref 30.0–36.0)
MCV: 91.5 fL (ref 80.0–100.0)
Platelets: 208 K/uL (ref 150–400)
RBC: 4.1 MIL/uL (ref 3.87–5.11)
RDW: 14 % (ref 11.5–15.5)
WBC: 3.3 K/uL — ABNORMAL LOW (ref 4.0–10.5)
nRBC: 0 % (ref 0.0–0.2)

## 2023-10-20 LAB — MRSA NEXT GEN BY PCR, NASAL: MRSA by PCR Next Gen: NOT DETECTED

## 2023-10-20 LAB — BASIC METABOLIC PANEL WITH GFR
Anion gap: 11 (ref 5–15)
BUN: 15 mg/dL (ref 8–23)
CO2: 24 mmol/L (ref 22–32)
Calcium: 8.9 mg/dL (ref 8.9–10.3)
Chloride: 102 mmol/L (ref 98–111)
Creatinine, Ser: 0.93 mg/dL (ref 0.44–1.00)
GFR, Estimated: 59 mL/min — ABNORMAL LOW (ref >60)
Glucose, Bld: 87 mg/dL (ref 70–99)
Potassium: 3.4 mmol/L — ABNORMAL LOW (ref 3.5–5.1)
Sodium: 137 mmol/L (ref 135–145)

## 2023-10-20 LAB — MAGNESIUM: Magnesium: 1.8 mg/dL (ref 1.7–2.4)

## 2023-10-20 MED ORDER — FLUTICASONE PROPIONATE 50 MCG/ACT NA SUSP
1.0000 | Freq: Every day | NASAL | Status: DC
  Administered 2023-10-20 – 2023-10-23 (×4): 1 via NASAL
  Filled 2023-10-20: qty 16

## 2023-10-20 MED ORDER — POTASSIUM CHLORIDE CRYS ER 20 MEQ PO TBCR
40.0000 meq | EXTENDED_RELEASE_TABLET | Freq: Once | ORAL | Status: AC
  Administered 2023-10-20: 40 meq via ORAL
  Filled 2023-10-20: qty 2

## 2023-10-20 MED ORDER — VANCOMYCIN HCL 750 MG/150ML IV SOLN
750.0000 mg | INTRAVENOUS | Status: DC
  Administered 2023-10-20 – 2023-10-21 (×2): 750 mg via INTRAVENOUS
  Filled 2023-10-20 (×2): qty 150

## 2023-10-20 MED ORDER — CALCIUM CARBONATE ANTACID 500 MG PO CHEW
800.0000 mg | CHEWABLE_TABLET | Freq: Once | ORAL | Status: AC
  Administered 2023-10-20: 800 mg via ORAL
  Filled 2023-10-20: qty 4

## 2023-10-20 NOTE — Progress Notes (Signed)
 Patient requesting Tums. Messaged Dr. Shona.

## 2023-10-20 NOTE — Progress Notes (Signed)
 [facility] PROGRESS NOTE   Tiffany Bradshaw FMW:[id number] DOB: Mar 01, 1932 DOA: 10/19/2023  PCP: Ransom Other, MD  Brief History: 89 y.o. female with medical history significant for hypertension, hyperlipidemia, CKD stage II, Alzheimer dementia, and asthma who presented with redness, pain, and swelling involving the lower right leg despite outpatient antibiotics.  She was on Bactrim for a few days which was changed over to ciprofloxacin which she took for maybe 1 to 2 days.  Consultants: None  Procedures: None    Subjective/Interval History: Patient complains of pain in the right lower extremity especially when she walks.  Her daughter is at the bedside.  Daughter thinks that the right leg appears to be slightly better today.  Not as red as yesterday.  No shortness of breath or chest pain reported.    Assessment/Plan:  Right lower extremity cellulitis She failed outpatient treatment with Bactrim and with ciprofloxacin.  Noted to be afebrile.  She actually has leukopenia on blood work. Patient currently noted to be on ceftriaxone and vancomycin.   Follow-up on blood cultures.  Await improvement in the cellulitis.  Low suspicion for DVT. Will obtain a plain film of the right lower extremity.  Hypokalemia Will be supplemented.  Chronic kidney disease stage II Stable.  Essential hypertension HCTZ on hold currently.  Continue metoprolol.  Monitor blood pressures.  History of asthma Stable.  Dementia Stable.  DVT Prophylaxis: Lovenox Code Status: Full code Family Communication: Discussed with her daughter Disposition Plan: Hopefully return home when improved  Status is: Inpatient Remains inpatient appropriate because: Right lower extremity cellulitis failed outpatient treatment, requiring IV antibiotics      Medications: Scheduled:  atorvastatin  10 mg Oral Daily   enoxaparin (LOVENOX) injection  40 mg Subcutaneous QHS   metoprolol succinate  25 mg Oral  Daily   Continuous:  cefTRIAXone (ROCEPHIN)  IV     vancomycin     EMW:[id number] **OR** acetaminophen, albuterol , oxyCODONE, polyethylene glycol, prochlorperazine  Antibiotics: Anti-infectives (From admission, onward)    Start     Dose/Rate Route Frequency Ordered Stop   10/21/23 1000  vancomycin (VANCOREADY) IVPB 1250 mg/250 mL  Status:  Discontinued        1,250 mg 166.7 mL/hr over 90 Minutes Intravenous Every 36 hours 10/19/23 2057 10/20/23 0823   10/20/23 2200  vancomycin (VANCOREADY) IVPB 750 mg/150 mL        750 mg 150 mL/hr over 60 Minutes Intravenous Every 24 hours 10/20/23 0823     10/20/23 1000  cefTRIAXone (ROCEPHIN) 1 g in sodium chloride 0.9 % 100 mL IVPB        1 g 200 mL/hr over 30 Minutes Intravenous Daily 10/19/23 2012     10/19/23 1915  vancomycin (VANCOCIN) IVPB 1000 mg/200 mL premix        1,000 mg 200 mL/hr over 60 Minutes Intravenous  Once 10/19/23 1910 10/19/23 2207   10/19/23 1915  ceFEPIme (MAXIPIME) 2 g in sodium chloride 0.9 % 100 mL IVPB        2 g 200 mL/hr over 30 Minutes Intravenous  Once 10/19/23 1910 10/19/23 2107       Objective:  Vital Signs  Vitals:   10/19/23 2337 10/20/23 0327 10/20/23 0624 10/20/23 0918  BP: (!) 115/46  (!) 116/54 (!) 111/52  Pulse: 67  62 65  Resp: 16  15 17   Temp: 99.5 F (37.5 C)  98.2 F (36.8 C) 97.8 F (36.6 C)  TempSrc:   Oral Oral  SpO2:  99%  94% 97%  Weight:  66.5 kg    Height:  5' 3 (1.6 m)      Intake/Output Summary (Last 24 hours) at 10/20/2023 0936 Last data filed at 10/19/2023 2107 Gross per 24 hour  Intake 100 ml  Output --  Net 100 ml   Filed Weights   10/19/23 2028 10/20/23 0327  Weight: 73.3 kg 66.5 kg    General appearance: Awake alert.  In no distress Resp: Clear to auscultation bilaterally.  Normal effort Cardio: S1-S2 is normal regular.  No S3-S4.  No rubs murmurs or bruit GI: Abdomen is soft.  Nontender nondistended.  Bowel sounds are present normal.  No masses  organomegaly Extremities: Erythema warmth involving the right lower extremity.  No areas of fluctuation. Neurologic: Alert and oriented x3.  No focal neurological deficits.    Lab Results:  Data Reviewed: I have personally reviewed following labs and reports of the imaging studies  CBC: Recent Labs  Lab 10/19/23 1757 10/20/23 0413  WBC 3.9* 3.3*  NEUTROABS 2.5  --   HGB 14.6 12.1  HCT 44.6 37.5  MCV 93.3 91.5  PLT 243 208    Basic Metabolic Panel: Recent Labs  Lab 10/19/23 1757 10/20/23 0413  NA 134* 137  K 3.4* 3.4*  CL 98 102  CO2 21* 24  GLUCOSE 79 87  BUN 17 15  CREATININE 1.13* 0.93  CALCIUM 10.1 8.9  MG  --  1.8    GFR: Estimated Creatinine Clearance: 37.5 mL/min (by C-G formula based on SCr of 0.93 mg/dL).  Liver Function Tests: Recent Labs  Lab 10/19/23 1757  AST 32  ALT 21  ALKPHOS 77  BILITOT 0.6  PROT 7.6  ALBUMIN 4.2    Radiology Studies: No results found.     LOS: 1 day   Yiselle Babich  [facility] Pager on [email or web address]  10/20/2023, 9:36 AM

## 2023-10-20 NOTE — Progress Notes (Signed)
 Pharmacy Antibiotic Note  Tiffany Bradshaw is a 89 y.o. female with LE cellulitis who was treated with bactrim/cipro PTA, presented to the ED on 10/19/2023 with RLE pain, redness and swelling.  She is currently on vancomycin and ceftriaxone for cellulitis.  Today, 10/20/2023: - day #1 abx - scr down 0.93 (crcl~37) - wbc 3.3 , Tmax 99.5  Plan: - adjust vancomycin to 750 mg q24h for est AUC 485 - ceftriaxone 1gm q24h   _____________________________________  Height: 5' 3 (160 cm) Weight: 66.5 kg (146 lb 8 oz) IBW/kg (Calculated) : 52.4  Temp (24hrs), Avg:98.4 F (36.9 C), Min:97.9 F (36.6 C), Max:99.5 F (37.5 C)  Recent Labs  Lab 10/19/23 1757 10/19/23 2010 10/20/23 0413  WBC 3.9*  --  3.3*  CREATININE 1.13*  --  0.93  LATICACIDVEN  --  0.7  --     Estimated Creatinine Clearance: 37.5 mL/min (by C-G formula based on SCr of 0.93 mg/dL).    Allergies  Allergen Reactions   Alendronate Other (See Comments)    Gi upset    Demerol [Meperidine]    Doxycycline     Augmentin  [Amoxicillin -Pot Clavulanate] Rash    Thank you for allowing pharmacy to be a part of this patient's care.  Tiffany Bradshaw 10/20/2023 8:17 AM

## 2023-10-21 DIAGNOSIS — L03115 Cellulitis of right lower limb: Secondary | ICD-10-CM | POA: Diagnosis not present

## 2023-10-21 DIAGNOSIS — J45909 Unspecified asthma, uncomplicated: Secondary | ICD-10-CM | POA: Diagnosis not present

## 2023-10-21 DIAGNOSIS — N182 Chronic kidney disease, stage 2 (mild): Secondary | ICD-10-CM | POA: Diagnosis not present

## 2023-10-21 DIAGNOSIS — G309 Alzheimer's disease, unspecified: Secondary | ICD-10-CM | POA: Diagnosis not present

## 2023-10-21 LAB — CBC
HCT: 36 % (ref 36.0–46.0)
Hemoglobin: 11.8 g/dL — ABNORMAL LOW (ref 12.0–15.0)
MCH: 30.6 pg (ref 26.0–34.0)
MCHC: 32.8 g/dL (ref 30.0–36.0)
MCV: 93.3 fL (ref 80.0–100.0)
Platelets: 192 K/uL (ref 150–400)
RBC: 3.86 MIL/uL — ABNORMAL LOW (ref 3.87–5.11)
RDW: 14.2 % (ref 11.5–15.5)
WBC: 2.9 K/uL — ABNORMAL LOW (ref 4.0–10.5)
nRBC: 0 % (ref 0.0–0.2)

## 2023-10-21 LAB — BASIC METABOLIC PANEL WITH GFR
Anion gap: 10 (ref 5–15)
BUN: 16 mg/dL (ref 8–23)
CO2: 25 mmol/L (ref 22–32)
Calcium: 9.3 mg/dL (ref 8.9–10.3)
Chloride: 103 mmol/L (ref 98–111)
Creatinine, Ser: 0.92 mg/dL (ref 0.44–1.00)
GFR, Estimated: 60 mL/min — ABNORMAL LOW (ref >60)
Glucose, Bld: 78 mg/dL (ref 70–99)
Potassium: 4 mmol/L (ref 3.5–5.1)
Sodium: 138 mmol/L (ref 135–145)

## 2023-10-21 LAB — URINALYSIS, W/ REFLEX TO CULTURE (INFECTION SUSPECTED)
Bilirubin Urine: NEGATIVE
Glucose, UA: NEGATIVE mg/dL
Hgb urine dipstick: NEGATIVE
Ketones, ur: NEGATIVE mg/dL
Leukocytes,Ua: NEGATIVE
Nitrite: NEGATIVE
Protein, ur: NEGATIVE mg/dL
Specific Gravity, Urine: 1.011 (ref 1.005–1.030)
pH: 5 (ref 5.0–8.0)

## 2023-10-21 LAB — IRON AND TIBC
Iron: 35 ug/dL (ref 28–170)
Saturation Ratios: 11 % (ref 10.4–31.8)
TIBC: 311 ug/dL (ref 250–450)
UIBC: 276 ug/dL

## 2023-10-21 LAB — FOLATE: Folate: 11.4 ng/mL (ref >5.9)

## 2023-10-21 LAB — VITAMIN B12: Vitamin B-12: 342 pg/mL (ref 180–914)

## 2023-10-21 LAB — TSH: TSH: 2.1 u[IU]/mL (ref 0.350–4.500)

## 2023-10-21 MED ORDER — ENOXAPARIN SODIUM 40 MG/0.4ML IJ SOSY
40.0000 mg | PREFILLED_SYRINGE | INTRAMUSCULAR | Status: DC
  Administered 2023-10-21 – 2023-10-22 (×2): 40 mg via SUBCUTANEOUS
  Filled 2023-10-21 (×2): qty 0.4

## 2023-10-21 NOTE — Progress Notes (Signed)
 PROGRESS NOTE    Tiffany Bradshaw  FMW:[id number] DOB: [birth year] DOA: 10/19/2023 PCP: Ransom Other, MD   Brief Narrative:  The patient is an 89 y.o. female with medical history significant for HTN, HLD, CKD stage II, Alzheimer dementia, and asthma who presented with redness, pain, and swelling involving the lower right leg despite outpatient antibiotics.  She was on Bactrim for a few days which was changed over to ciprofloxacin which she took for maybe 1 to 2 days. Now brought in due to failing outpatient Bactrim and ciprofloxacin and on IV Ceftriaxone and Vancomycin and improving slowly.  Assessment and Plan:  Right Lower Extremity Cellulitis: She failed outpatient treatment with Bactrim and with ciprofloxacin.  Noted to be afebrile.  She actually has leukopenia on blood work and remains Leukopenic. Patient currently noted to be on ceftriaxone and vancomycin.  Follow-up on blood cultures.  Await improvement in the cellulitis.  Low suspicion for DVT but will still r/o. Will obtain a plain film of the right lower extremity and it showed Diffuse soft tissue swelling with no acute osseous abnormalities identified. If not improving will pursue CT Scan of the Leg.    Hypokalemia: Improved. K+ is now 4.0. CTM and Replete as Necessary. Repeat CMP in the AM   Acute on Chronic kidney disease stage II: BUN/Cr Trend Stable and improved: Recent Labs  Lab 10/19/23 1757 10/20/23 0413 10/21/23 0321  BUN 17 15 16   CREATININE 1.13* 0.93 0.92  -Avoid Nephrotoxic Medications, Contrast Dyes, Hypotension and Dehydration to Ensure Adequate Renal Perfusion and will need to Renally Adjust Meds; CTM and Trend Renal Function carefully and repeat CMP in the AM    Essential Hypertension: HCTZ on hold currently.  Continue Metoprolol Succinate 25 mg po Daily. CTM BP per Protocol. Last BP reading was 117/65   History of asthma: Stable.   Dementia: Stable; C/w Delirium Precautions   Leukopenia: WBC Trend:  Recent  Labs  Lab 10/19/23 1757 10/20/23 0413 10/21/23 0321  WBC 3.9* 3.3* 2.9*  -On Abx as above. CTM for S/Sx of Infection. Repeat CBC in the AM  Normocytic Anemia: Hgb/Hct Trend:  Recent Labs  Lab 10/19/23 1757 10/20/23 0413 10/21/23 0321  HGB 14.6 12.1 11.8*  HCT 44.6 37.5 36.0  MCV 93.3 91.5 93.3  -Check Anemia Panel in the AM. Folate Level was 11.4 and Vitamin B12 was 342. CTM for S/Sx of Bleeding; No overt bleeding noted. Repeat CBC in the AM  Overweight: Complicates overall prognosis and care. Estimated body mass index is 25.95 kg/m as calculated from the following:   Height as of this encounter: 5' 3 (1.6 m).   Weight as of this encounter: 66.5 kg. Weight Loss and Dietary Counseling given   DVT prophylaxis: SCDs; Will ordere Enoxaparin sq    Code Status: Full Code Family Communication: D/w family present @ bedside   Disposition Plan:  Level of care: Med-Surg Status is: Inpatient Remains inpatient appropriate because: Needs further clinical improvement and clearance by the specialists    Consultants:  None  Procedures:  As delineated as above   Antimicrobials:  Anti-infectives (From admission, onward)    Start     Dose/Rate Route Frequency Ordered Stop   10/21/23 1000  vancomycin (VANCOREADY) IVPB 1250 mg/250 mL  Status:  Discontinued        1,250 mg 166.7 mL/hr over 90 Minutes Intravenous Every 36 hours 10/19/23 2057 10/20/23 0823   10/20/23 2200  vancomycin (VANCOREADY) IVPB 750 mg/150 mL  750 mg 150 mL/hr over 60 Minutes Intravenous Every 24 hours 10/20/23 0823     10/20/23 1000  cefTRIAXone (ROCEPHIN) 1 g in sodium chloride 0.9 % 100 mL IVPB        1 g 200 mL/hr over 30 Minutes Intravenous Daily 10/19/23 2012     10/19/23 1915  vancomycin (VANCOCIN) IVPB 1000 mg/200 mL premix        1,000 mg 200 mL/hr over 60 Minutes Intravenous  Once 10/19/23 1910 10/20/23 0955   10/19/23 1915  ceFEPIme (MAXIPIME) 2 g in sodium chloride 0.9 % 100 mL IVPB        2  g 200 mL/hr over 30 Minutes Intravenous  Once 10/19/23 1910 10/19/23 2107       Subjective: Seen and examined at bedside and was still having some swelling in her leg but thinks is getting bit better.  Family states that it was tender to palpate and sore.  No nausea or vomiting.  Denies any other concerns or complaints at this time.  Objective: Vitals:   10/20/23 1332 10/20/23 2300 10/21/23 0543 10/21/23 1338  BP: (!) 135/57 (!) 158/59 (!) 120/52 117/65  Pulse: 63 68 (!) 58 69  Resp: 17 18 17 18   Temp: 98 F (36.7 C) 97.9 F (36.6 C) 97.9 F (36.6 C) 98.3 F (36.8 C)  TempSrc: Oral Oral    SpO2: 98% 98% 96% 96%  Weight:      Height:        Intake/Output Summary (Last 24 hours) at 10/21/2023 1919 Last data filed at 10/21/2023 1800 Gross per 24 hour  Intake 620 ml  Output 600 ml  Net 20 ml   Filed Weights   10/19/23 2028 10/20/23 0327  Weight: 73.3 kg 66.5 kg   Examination: Physical Exam:  Constitutional: WN/WD, overweight elderly chronically ill-appearing Caucasian female in no acute distress Respiratory: Diminished to auscultation bilaterally, no wheezing, rales, rhonchi or crackles. Normal respiratory effort and patient is not tachypenic. No accessory muscle use.  Unlabored breathing Cardiovascular: RRR, no murmurs / rubs / gallops. S1 and S2 auscultated.  Some right lower extremity swelling and erythema Abdomen: Soft, non-tender, distended secondary body habitus. Bowel sounds positive.  GU: Deferred. Musculoskeletal: No clubbing / cyanosis of digits/nails. No joint deformity upper and lower extremities.  Skin: Right leg is warm and swollen Neurologic: CN 2-12 grossly intact with no focal deficits. Romberg sign and cerebellar reflexes not assessed.  Psychiatric: Normal judgment and insight. Alert and oriented x 3. Normal mood and appropriate affect.   Data Reviewed: I have personally reviewed following labs and imaging studies  CBC: Recent Labs  Lab 10/19/23 1757  10/20/23 0413 10/21/23 0321  WBC 3.9* 3.3* 2.9*  NEUTROABS 2.5  --   --   HGB 14.6 12.1 11.8*  HCT 44.6 37.5 36.0  MCV 93.3 91.5 93.3  PLT 243 208 192   Basic Metabolic Panel: Recent Labs  Lab 10/19/23 1757 10/20/23 0413 10/21/23 0321  NA 134* 137 138  K 3.4* 3.4* 4.0  CL 98 102 103  CO2 21* 24 25  GLUCOSE 79 87 78  BUN 17 15 16   CREATININE 1.13* 0.93 0.92  CALCIUM 10.1 8.9 9.3  MG  --  1.8  --    GFR: Estimated Creatinine Clearance: 38 mL/min (by C-G formula based on SCr of 0.92 mg/dL). Liver Function Tests: Recent Labs  Lab 10/19/23 1757  AST 32  ALT 21  ALKPHOS 77  BILITOT 0.6  PROT 7.6  ALBUMIN 4.2   No results for input(s): LIPASE, AMYLASE in the last 168 hours. No results for input(s): AMMONIA in the last 168 hours. Coagulation Profile: No results for input(s): INR, PROTIME in the last 168 hours. Cardiac Enzymes: No results for input(s): CKTOTAL, CKMB, CKMBINDEX, TROPONINI in the last 168 hours. BNP (last 3 results) No results for input(s): PROBNP in the last 8760 hours. HbA1C: No results for input(s): HGBA1C in the last 72 hours. CBG: No results for input(s): GLUCAP in the last 168 hours. Lipid Profile: No results for input(s): CHOL, HDL, LDLCALC, TRIG, CHOLHDL, LDLDIRECT in the last 72 hours. Thyroid  Function Tests: Recent Labs    10/21/23 0321  TSH 2.100   Anemia Panel: Recent Labs    10/21/23 0321  VITAMINB12 342  FOLATE 11.4   Sepsis Labs: Recent Labs  Lab 10/19/23 2010  LATICACIDVEN 0.7   Recent Results (from the past 240 hours)  MRSA Next Gen by PCR, Nasal     Status: None   Collection Time: 10/20/23 10:01 AM   Specimen: Nasal Mucosa; Nasal Swab  Result Value Ref Range Status   MRSA by PCR Next Gen NOT DETECTED NOT DETECTED Final    Comment: (NOTE) The GeneXpert MRSA Assay (FDA approved for NASAL specimens only), is one component of a comprehensive MRSA colonization surveillance program.  It is not intended to diagnose MRSA infection nor to guide or monitor treatment for MRSA infections. Test performance is not FDA approved in patients less than 76 years old. Performed at [facility], [address], [place], [place] [address]     Radiology Studies: DG Tibia/Fibula Right Port Result Date: 10/20/2023 CLINICAL DATA:  Pain and redness in the right lower leg EXAM: PORTABLE RIGHT TIBIA AND FIBULA - 2 VIEW COMPARISON:  None Available. FINDINGS: Diffuse soft tissue swelling throughout the right leg. No acute fractures identified. Knee and ankle joints are approximated. Soft tissue calcifications about the lower leg. IMPRESSION: Diffuse soft tissue swelling with no acute osseous abnormalities identified. Electronically Signed   By: Michaeline Blanch M.D.   On: 10/20/2023 14:02   Scheduled Meds:  atorvastatin  10 mg Oral Daily   fluticasone  1 spray Each Nare Daily   metoprolol succinate  25 mg Oral Daily   Continuous Infusions:  cefTRIAXone (ROCEPHIN)  IV 1 g (10/21/23 1021)   vancomycin 750 mg (10/20/23 2202)    LOS: 2 days   Alejandro Marker, DO [facility] Available via Epic secure chat 7am-7pm After these hours, please refer to coverage provider listed on [email or web address] 10/21/2023, 7:19 PM

## 2023-10-21 NOTE — Hospital Course (Addendum)
 The patient is an 89 y.o. female with medical history significant for HTN, HLD, CKD stage II, Alzheimer dementia, and asthma who presented with redness, pain, and swelling involving the lower right leg despite outpatient antibiotics.  She was on Bactrim for a few days which was changed over to ciprofloxacin which she took for maybe 1 to 2 days. Now brought in due to failing outpatient Bactrim and ciprofloxacin and on IV Ceftriaxone and Vancomycin and improving slowly.  Given her lack of further improvement and slow progress ID was consulted and have discontinued her current medications and started her on oral Linezolid 6 mg p.o. twice daily for 10 days.  Assessment and Plan:  Right Lower Extremity Cellulitis: She failed outpatient treatment with Bactrim and with ciprofloxacin.  Noted to be afebrile.  She actually has leukopenia on blood work and remains Leukopenic. Patient currently noted to be on ceftriaxone and vancomycin.  Follow-up on blood cultures.  Await improvement in the cellulitis.  Low suspicion for DVT but will still r/o and LE  Venous Duplex on the Right showed no DVT. Will obtain a plain film of the right lower extremity and it showed Diffuse soft tissue swelling with no acute osseous abnormalities identified. If not improving will pursue CT Scan of the Leg. ID consulted and commending stopping antibiotics currently and changing to oral linezolid 600 mg p.o. twice daily for 10 more days and following up in ID clinic on 11/03/2023 at 8:30 AM -Will obtain PT/OT to Evaluate and Treat and they are recommending rolling walker for discharge.  She is medically stable and will need to follow-up with PCP and infectious disease within 1 to 2 weeks   Hypokalemia: Improved. K+ is now 3.8 at the time of discharge. CTM and Replete as Necessary. Repeat CMP in the AM   Acute on Chronic kidney disease stage II: BUN/Cr Trend Stable and improved: Recent Labs  Lab 10/19/23 1757 10/20/23 0413 10/21/23 0321  10/22/23 0319 10/23/23 0850  BUN 17 15 16 15 15   CREATININE 1.13* 0.93 0.92 0.77 0.89  -Avoid Nephrotoxic Medications, Contrast Dyes, Hypotension and Dehydration to Ensure Adequate Renal Perfusion and will need to Renally Adjust Meds; CTM and Trend Renal Function carefully and repeat CMP within 1 week   Essential Hypertension: HCTZ on hold currently.  Continue Metoprolol Succinate 25 mg po Daily. CTM BP per Protocol. Last BP reading was 127/76   History of asthma: Stable.   Dementia: Stable; C/w Delirium Precautions   Leukopenia: WBC Trend:  Recent Labs  Lab 10/19/23 1757 10/20/23 0413 10/21/23 0321 10/22/23 0319 10/23/23 0850  WBC 3.9* 3.3* 2.9* 2.7* 2.7*  -On Abx as above. CTM for S/Sx of Infection and ID is changing Abx as above. Repeat CBC within 1 week  Normocytic Anemia: Hgb/Hct Trend:  Recent Labs  Lab 10/19/23 1757 10/20/23 0413 10/21/23 0321 10/22/23 0319 10/23/23 0850  HGB 14.6 12.1 11.8* 11.9* 13.0  HCT 44.6 37.5 36.0 37.4 41.9  MCV 93.3 91.5 93.3 92.6 93.7  - Anemia panel was checked and showed an iron level of 35, UIBC of 276, TIBC 311, saturation ratios of 11%, ferritin level 134, folate level of 12.1 and vitamin B12 378. CTM for S/Sx of Bleeding; No overt bleeding noted. Repeat CBC within 1 week  Overweight: Complicates overall prognosis and care. Estimated body mass index is 25.95 kg/m as calculated from the following:   Height as of this encounter: 5' 3 (1.6 m).   Weight as of this encounter: 66.5 kg. Weight  Loss and Dietary Counseling given

## 2023-10-22 ENCOUNTER — Telehealth (HOSPITAL_COMMUNITY): Payer: Self-pay | Admitting: Pharmacy Technician

## 2023-10-22 ENCOUNTER — Other Ambulatory Visit (HOSPITAL_COMMUNITY): Payer: Self-pay

## 2023-10-22 ENCOUNTER — Inpatient Hospital Stay (HOSPITAL_COMMUNITY)

## 2023-10-22 DIAGNOSIS — L03115 Cellulitis of right lower limb: Secondary | ICD-10-CM | POA: Diagnosis not present

## 2023-10-22 DIAGNOSIS — N182 Chronic kidney disease, stage 2 (mild): Secondary | ICD-10-CM | POA: Diagnosis not present

## 2023-10-22 DIAGNOSIS — G309 Alzheimer's disease, unspecified: Secondary | ICD-10-CM | POA: Diagnosis not present

## 2023-10-22 DIAGNOSIS — M7989 Other specified soft tissue disorders: Secondary | ICD-10-CM | POA: Diagnosis not present

## 2023-10-22 DIAGNOSIS — J45909 Unspecified asthma, uncomplicated: Secondary | ICD-10-CM | POA: Diagnosis not present

## 2023-10-22 LAB — CBC WITH DIFFERENTIAL/PLATELET
Abs Immature Granulocytes: 0.01 K/uL (ref 0.00–0.07)
Basophils Absolute: 0 K/uL (ref 0.0–0.1)
Basophils Relative: 1 %
Eosinophils Absolute: 0.1 K/uL (ref 0.0–0.5)
Eosinophils Relative: 3 %
HCT: 37.4 % (ref 36.0–46.0)
Hemoglobin: 11.9 g/dL — ABNORMAL LOW (ref 12.0–15.0)
Immature Granulocytes: 0 %
Lymphocytes Relative: 37 %
Lymphs Abs: 1 K/uL (ref 0.7–4.0)
MCH: 29.5 pg (ref 26.0–34.0)
MCHC: 31.8 g/dL (ref 30.0–36.0)
MCV: 92.6 fL (ref 80.0–100.0)
Monocytes Absolute: 0.4 K/uL (ref 0.1–1.0)
Monocytes Relative: 14 %
Neutro Abs: 1.2 K/uL — ABNORMAL LOW (ref 1.7–7.7)
Neutrophils Relative %: 45 %
Platelets: 200 K/uL (ref 150–400)
RBC: 4.04 MIL/uL (ref 3.87–5.11)
RDW: 14.2 % (ref 11.5–15.5)
WBC: 2.7 K/uL — ABNORMAL LOW (ref 4.0–10.5)
nRBC: 0 % (ref 0.0–0.2)

## 2023-10-22 LAB — FOLATE: Folate: 12.1 ng/mL (ref >5.9)

## 2023-10-22 LAB — COMPREHENSIVE METABOLIC PANEL WITH GFR
ALT: 19 U/L (ref 0–44)
AST: 28 U/L (ref 15–41)
Albumin: 3.3 g/dL — ABNORMAL LOW (ref 3.5–5.0)
Alkaline Phosphatase: 58 U/L (ref 38–126)
Anion gap: 10 (ref 5–15)
BUN: 15 mg/dL (ref 8–23)
CO2: 24 mmol/L (ref 22–32)
Calcium: 8.7 mg/dL — ABNORMAL LOW (ref 8.9–10.3)
Chloride: 103 mmol/L (ref 98–111)
Creatinine, Ser: 0.77 mg/dL (ref 0.44–1.00)
GFR, Estimated: >60 mL/min (ref >60)
Glucose, Bld: 78 mg/dL (ref 70–99)
Potassium: 3.9 mmol/L (ref 3.5–5.1)
Sodium: 137 mmol/L (ref 135–145)
Total Bilirubin: 0.3 mg/dL (ref 0.0–1.2)
Total Protein: 5.6 g/dL — ABNORMAL LOW (ref 6.5–8.1)

## 2023-10-22 LAB — VITAMIN B12: Vitamin B-12: 378 pg/mL (ref 180–914)

## 2023-10-22 LAB — FERRITIN: Ferritin: 134 ng/mL (ref 11–307)

## 2023-10-22 LAB — RETICULOCYTES
Immature Retic Fract: 9.7 % (ref 2.3–15.9)
RBC.: 4.06 MIL/uL (ref 3.87–5.11)
Retic Count, Absolute: 54 K/uL (ref 19.0–186.0)
Retic Ct Pct: 1.3 % (ref 0.4–3.1)

## 2023-10-22 LAB — MAGNESIUM: Magnesium: 2 mg/dL (ref 1.7–2.4)

## 2023-10-22 LAB — PHOSPHORUS: Phosphorus: 2.7 mg/dL (ref 2.5–4.6)

## 2023-10-22 MED ORDER — LINEZOLID 600 MG PO TABS
600.0000 mg | ORAL_TABLET | Freq: Two times a day (BID) | ORAL | Status: DC
Start: 2023-10-22 — End: 2023-10-23
  Administered 2023-10-22 – 2023-10-23 (×2): 600 mg via ORAL
  Filled 2023-10-22 (×3): qty 1

## 2023-10-22 NOTE — Progress Notes (Signed)
 PROGRESS NOTE    Tiffany Bradshaw  FMW:[id number] DOB: 1932/06/22 DOA: 10/19/2023 PCP: Ransom Other, MD   Brief Narrative:  The patient is an 89 y.o. female with medical history significant for HTN, HLD, CKD stage II, Alzheimer dementia, and asthma who presented with redness, pain, and swelling involving the lower right leg despite outpatient antibiotics.  She was on Bactrim for a few days which was changed over to ciprofloxacin which she took for maybe 1 to 2 days. Now brought in due to failing outpatient Bactrim and ciprofloxacin and on IV Ceftriaxone and Vancomycin and improving slowly.  Given her lack of further improvement and slow progress ID was consulted and have discontinued her current medications and started her on oral Linezolid 6 mg p.o. twice daily for 10 days.  Assessment and Plan:  Right Lower Extremity Cellulitis: She failed outpatient treatment with Bactrim and with ciprofloxacin.  Noted to be afebrile.  She actually has leukopenia on blood work and remains Leukopenic. Patient currently noted to be on ceftriaxone and vancomycin.  Follow-up on blood cultures.  Await improvement in the cellulitis.  Low suspicion for DVT but will still r/o and LE  Venous Duplex on the Right showed no DVT. Will obtain a plain film of the right lower extremity and it showed Diffuse soft tissue swelling with no acute osseous abnormalities identified. If not improving will pursue CT Scan of the Leg. ID consulted and commending stopping antibiotics currently and changing to oral linezolid 600 mg p.o. twice daily for 10 more days and following up in ID clinic on 11/03/2023 at 8:30 AM -Will obtain PT/OT to Evaluate and Treat   Hypokalemia: Improved. K+ is now 3.9. CTM and Replete as Necessary. Repeat CMP in the AM   Acute on Chronic kidney disease stage II: BUN/Cr Trend Stable and improved: Recent Labs  Lab 10/19/23 1757 10/20/23 0413 10/21/23 0321 10/22/23 0319  BUN 17 15 16 15   CREATININE 1.13*  0.93 0.92 0.77  -Avoid Nephrotoxic Medications, Contrast Dyes, Hypotension and Dehydration to Ensure Adequate Renal Perfusion and will need to Renally Adjust Meds; CTM and Trend Renal Function carefully and repeat CMP in the AM    Essential Hypertension: HCTZ on hold currently.  Continue Metoprolol Succinate 25 mg po Daily. CTM BP per Protocol. Last BP reading was 127/76   History of asthma: Stable.   Dementia: Stable; C/w Delirium Precautions   Leukopenia: WBC Trend:  Recent Labs  Lab 10/19/23 1757 10/20/23 0413 10/21/23 0321 10/22/23 0319  WBC 3.9* 3.3* 2.9* 2.7*  -On Abx as above. CTM for S/Sx of Infection and ID is changing Abx as above. Repeat CBC in the AM  Normocytic Anemia: Hgb/Hct Trend:  Recent Labs  Lab 10/19/23 1757 10/20/23 0413 10/21/23 0321 10/22/23 0319  HGB 14.6 12.1 11.8* 11.9*  HCT 44.6 37.5 36.0 37.4  MCV 93.3 91.5 93.3 92.6  -Check Anemia Panel in the AM. Folate Level was 11.4 and Vitamin B12 was 342. CTM for S/Sx of Bleeding; No overt bleeding noted. Repeat CBC in the AM  Overweight: Complicates overall prognosis and care. Estimated body mass index is 25.95 kg/m as calculated from the following:   Height as of this encounter: 5' 3 (1.6 m).   Weight as of this encounter: 66.5 kg. Weight Loss and Dietary Counseling given   DVT prophylaxis: enoxaparin (LOVENOX) injection 40 mg Start: 10/21/23 2200    Code Status: Full Code Family Communication: Discussed with family at bedside  Disposition Plan:  Level of care:  Med-Surg Status is: Inpatient Remains inpatient appropriate because: She is now cleared from a ID perspective but will need PT OT to evaluate and treat for further safe discharge disposition   Consultants:  Infectious Diseases  Procedures:  As delineated as above; lower extremity venous duplex  Antimicrobials:  Anti-infectives (From admission, onward)    Start     Dose/Rate Route Frequency Ordered Stop   10/22/23 1530  linezolid  (ZYVOX) tablet 600 mg        600 mg Oral Every 12 hours 10/22/23 1434 11/01/23 1529   10/21/23 1000  vancomycin (VANCOREADY) IVPB 1250 mg/250 mL  Status:  Discontinued        1,250 mg 166.7 mL/hr over 90 Minutes Intravenous Every 36 hours 10/19/23 2057 10/20/23 0823   10/20/23 2200  vancomycin (VANCOREADY) IVPB 750 mg/150 mL  Status:  Discontinued        750 mg 150 mL/hr over 60 Minutes Intravenous Every 24 hours 10/20/23 0823 10/22/23 1434   10/20/23 1000  cefTRIAXone (ROCEPHIN) 1 g in sodium chloride 0.9 % 100 mL IVPB  Status:  Discontinued        1 g 200 mL/hr over 30 Minutes Intravenous Daily 10/19/23 2012 10/22/23 1434   10/19/23 1915  vancomycin (VANCOCIN) IVPB 1000 mg/200 mL premix        1,000 mg 200 mL/hr over 60 Minutes Intravenous  Once 10/19/23 1910 10/20/23 0955   10/19/23 1915  ceFEPIme (MAXIPIME) 2 g in sodium chloride 0.9 % 100 mL IVPB        2 g 200 mL/hr over 30 Minutes Intravenous  Once 10/19/23 1910 10/19/23 2107       Subjective: Seen and examined at bedside and leg was still swollen but not as much in the ankle.  The blotchy erythema and duskiness with hyperpigmentation was still there but daughter thinks that the leg is not as painful to palpate.  No nausea or vomiting.  Denies any other concerns or complaint at this time.  Objective: Vitals:   10/21/23 1338 10/21/23 2136 10/22/23 0514 10/22/23 0856  BP: 117/65 (!) 143/57 135/62 127/76  Pulse: 69 72 75 75  Resp: 18 15 15 16   Temp: 98.3 F (36.8 C) 98.2 F (36.8 C) 98.3 F (36.8 C) 97.9 F (36.6 C)  TempSrc:  Oral Oral Oral  SpO2: 96% 93% 92% 95%  Weight:      Height:        Intake/Output Summary (Last 24 hours) at 10/22/2023 1809 Last data filed at 10/22/2023 0343 Gross per 24 hour  Intake 150 ml  Output --  Net 150 ml   Filed Weights   10/19/23 2028 10/20/23 0327  Weight: 73.3 kg 66.5 kg   Examination: Physical Exam:  Constitutional: WN/WD, overweight elderly chronically ill-appearing  Caucasian female in no acute distress Respiratory: Diminished to auscultation bilaterally, no wheezing, rales, rhonchi or crackles. Normal respiratory effort and patient is not tachypenic. No accessory muscle use.  Unlabored breathing Cardiovascular: RRR, no murmurs / rubs / gallops. S1 and S2 auscultated.  Right leg is still erythematous and swollen with some blotchiness but the ankle is not swollen Abdomen: Soft, non-tender, slightly tender to palpate. Bowel sounds positive.  GU: Deferred. Musculoskeletal: No clubbing / cyanosis of digits/nails. No joint deformity upper and lower extremities.  Skin: No rashes, lesions, ulcers on limited skin evaluation. No induration; Warm and dry.  Neurologic: CN 2-12 grossly intact with no focal deficits. Romberg sign and cerebellar reflexes not assessed.  Psychiatric:  Normal judgment and insight. Alert and oriented x 3. Normal mood and appropriate affect.   Data Reviewed: I have personally reviewed following labs and imaging studies  CBC: Recent Labs  Lab 10/19/23 1757 10/20/23 0413 10/21/23 0321 10/22/23 0319  WBC 3.9* 3.3* 2.9* 2.7*  NEUTROABS 2.5  --   --  1.2*  HGB 14.6 12.1 11.8* 11.9*  HCT 44.6 37.5 36.0 37.4  MCV 93.3 91.5 93.3 92.6  PLT 243 208 192 200   Basic Metabolic Panel: Recent Labs  Lab 10/19/23 1757 10/20/23 0413 10/21/23 0321 10/22/23 0319  NA 134* 137 138 137  K 3.4* 3.4* 4.0 3.9  CL 98 102 103 103  CO2 21* 24 25 24   GLUCOSE 79 87 78 78  BUN 17 15 16 15   CREATININE 1.13* 0.93 0.92 0.77  CALCIUM 10.1 8.9 9.3 8.7*  MG  --  1.8  --  2.0  PHOS  --   --   --  2.7   GFR: Estimated Creatinine Clearance: 43.7 mL/min (by C-G formula based on SCr of 0.77 mg/dL). Liver Function Tests: Recent Labs  Lab 10/19/23 1757 10/22/23 0319  AST 32 28  ALT 21 19  ALKPHOS 77 58  BILITOT 0.6 0.3  PROT 7.6 5.6*  ALBUMIN 4.2 3.3*   No results for input(s): LIPASE, AMYLASE in the last 168 hours. No results for input(s):  AMMONIA in the last 168 hours. Coagulation Profile: No results for input(s): INR, PROTIME in the last 168 hours. Cardiac Enzymes: No results for input(s): CKTOTAL, CKMB, CKMBINDEX, TROPONINI in the last 168 hours. BNP (last 3 results) No results for input(s): PROBNP in the last 8760 hours. HbA1C: No results for input(s): HGBA1C in the last 72 hours. CBG: No results for input(s): GLUCAP in the last 168 hours. Lipid Profile: No results for input(s): CHOL, HDL, LDLCALC, TRIG, CHOLHDL, LDLDIRECT in the last 72 hours. Thyroid  Function Tests: Recent Labs    10/21/23 0321  TSH 2.100   Anemia Panel: Recent Labs    10/21/23 0321 10/21/23 1927 10/22/23 0319  VITAMINB12 342  --  378  FOLATE 11.4  --  12.1  FERRITIN  --   --  134  TIBC  --  311  --   IRON  --  35  --   RETICCTPCT  --   --  1.3   Sepsis Labs: Recent Labs  Lab 10/19/23 2010  LATICACIDVEN 0.7   Recent Results (from the past 240 hours)  MRSA Next Gen by PCR, Nasal     Status: None   Collection Time: 10/20/23 10:01 AM   Specimen: Nasal Mucosa; Nasal Swab  Result Value Ref Range Status   MRSA by PCR Next Gen NOT DETECTED NOT DETECTED Final    Comment: (NOTE) The GeneXpert MRSA Assay (FDA approved for NASAL specimens only), is one component of a comprehensive MRSA colonization surveillance program. It is not intended to diagnose MRSA infection nor to guide or monitor treatment for MRSA infections. Test performance is not FDA approved in patients less than 35 years old. Performed at [facility], [address], [place], [place] [address]     Radiology Studies: VAS US  LOWER EXTREMITY VENOUS (DVT) Result Date: 10/22/2023  Lower Venous DVT Study Patient Name:  Tiffany Bradshaw  Date of Exam:   10/22/2023 Medical Rec #: [id number]         Accession #:    [id number] Date of Birth: [birth year]  Patient Gender: F Patient Age:   9 years Exam Location:  [facility] Procedure:      VAS US  LOWER EXTREMITY VENOUS (DVT) Referring Phys: ALEJANDRO MARKER --------------------------------------------------------------------------------  Indications: Pain, Swelling, Erythema, and Cellulitis.  Comparison Study: No prior study on file Performing Technologist: Alberta Lis RVS  Examination Guidelines: A complete evaluation includes B-mode imaging, spectral Doppler, color Doppler, and power Doppler as needed of all accessible portions of each vessel. Bilateral testing is considered an integral part of a complete examination. Limited examinations for reoccurring indications may be performed as noted. The reflux portion of the exam is performed with the patient in reverse Trendelenburg.  +---------+---------------+---------+-----------+----------+--------------+ RIGHT    CompressibilityPhasicitySpontaneityPropertiesThrombus Aging +---------+---------------+---------+-----------+----------+--------------+ CFV      Full           No       No                                  +---------+---------------+---------+-----------+----------+--------------+ SFJ      Full                                                        +---------+---------------+---------+-----------+----------+--------------+ FV Prox  Full           Yes      No                                  +---------+---------------+---------+-----------+----------+--------------+ FV Mid   Full                                                        +---------+---------------+---------+-----------+----------+--------------+ FV DistalFull           Yes      No                                  +---------+---------------+---------+-----------+----------+--------------+ PFV      Full           Yes      No                                  +---------+---------------+---------+-----------+----------+--------------+ POP      Full           Yes      No                                   +---------+---------------+---------+-----------+----------+--------------+ PTV      Full                                                        +---------+---------------+---------+-----------+----------+--------------+ PERO     Full                                                        +---------+---------------+---------+-----------+----------+--------------+  Gastroc  Full                                                        +---------+---------------+---------+-----------+----------+--------------+   +----+---------------+---------+-----------+----------+--------------+ LEFTCompressibilityPhasicitySpontaneityPropertiesThrombus Aging +----+---------------+---------+-----------+----------+--------------+ CFV Full           Yes      No                                  +----+---------------+---------+-----------+----------+--------------+ SFJ Full                                                        +----+---------------+---------+-----------+----------+--------------+    Summary: RIGHT: - There is no evidence of deep vein thrombosis in the lower extremity.  - No cystic structure found in the popliteal fossa.  LEFT: - No evidence of common femoral vein obstruction.   *See table(s) above for measurements and observations. Electronically signed by Debby Robertson on 10/22/2023 at 5:39:15 PM.    Final    Scheduled Meds:  atorvastatin  10 mg Oral Daily   enoxaparin (LOVENOX) injection  40 mg Subcutaneous Q24H   fluticasone  1 spray Each Nare Daily   linezolid  600 mg Oral Q12H   metoprolol succinate  25 mg Oral Daily   Continuous Infusions:   LOS: 3 days   Alejandro Marker, DO [facility] Available via Epic secure chat 7am-7pm After these hours, please refer to coverage provider listed on [email or web address] 10/22/2023, 6:09 PM

## 2023-10-22 NOTE — TOC Initial Note (Signed)
 Transition of Care [facility]) - Initial/Assessment Note    Patient Details  Name: Tiffany Bradshaw MRN: [id number] Date of Birth: May 02, 1932  [facility]) CM/SW Contact:    NORMAN ASPEN, LCSW Phone Number: 10/22/2023, 11:42 AM  Clinical Narrative:                 Met with patient and daughter today to review potential dc needs.  Pt very quiet and allowing daughter to answer all questions, however, she does nod along and paying attention.  Explained CSW role with dc planning.  Daughter confirms pt lives alone with family living close by locally/ surrounding area.  Daughters check on patient several times/ week and assist with home management and transportation.  Daughter notes pt's baseline is independent with mobility and ADLs.  Pt and daughter hopeful she will be cleared for dc by tomorrow.  No concerns from pt/ family for pt's return home.  Aware IP CM will monitor for any dc referral needs.    Expected Discharge Plan: Home/Self Care Barriers to Discharge: Continued Medical Work up   Patient Goals and CMS Choice Patient states their goals for this hospitalization and ongoing recovery are:: return home          Expected Discharge Plan and Services       Living arrangements for the past 2 months: Single Family Home                                      Prior Living Arrangements/Services Living arrangements for the past 2 months: Single Family Home Lives with:: Self Patient language and need for interpreter reviewed:: Yes Do you feel safe going back to the place where you live?: Yes      Need for Family Participation in Patient Care: Yes (Comment) Care giver support system in place?: Yes (comment)   Criminal Activity/Legal Involvement Pertinent to Current Situation/Hospitalization: No - Comment as needed  Activities of Daily Living   ADL Screening (condition at time of admission) Independently performs ADLs?: Yes (appropriate for developmental age) Is the  patient deaf or have difficulty hearing?: Yes Does the patient have difficulty seeing, even when wearing glasses/contacts?: No Does the patient have difficulty concentrating, remembering, or making decisions?: Yes  Permission Sought/Granted Permission sought to share information with : Family Supports Permission granted to share information with : Yes, Verbal Permission Granted  Share Information with NAME: daughter, Holley Burkes @ [phone]           Emotional Assessment Appearance:: Appears stated age   Affect (typically observed): Quiet Orientation: : Oriented to Self, Oriented to Place, Oriented to  Time, Oriented to Situation Alcohol / Substance Use: Not Applicable Psych Involvement: No (comment)  Admission diagnosis:  Cellulitis of right lower extremity [L03.115] Cellulitis, unspecified cellulitis site [L03.90] Patient Active Problem List   Diagnosis Date Noted   Cellulitis of right lower extremity 10/19/2023   CKD (chronic kidney disease) stage 2, GFR 60-89 ml/min 10/19/2023   Alzheimer dementia (HCC) 10/19/2023   Hyperlipidemia 10/19/2023   Asthma, chronic 10/19/2023   Hypertension    PCP:  Ransom Other, MD Pharmacy:   [facility] [phone] [address], [place] - [address] [place] [address] Phone: [phone] Fax: [phone]  [id number] - [place],  - [address] AT [facility] [address] [place] [address] Phone:  [phone] Fax: [phone]     Social Drivers of Health (SDOH) Social History: SDOH Screenings   Food Insecurity: No Food Insecurity (10/19/2023)  Housing: Low Risk  (10/19/2023)  Transportation Needs: No Transportation Needs (10/19/2023)  Utilities: Not At Risk (10/19/2023)  Social Connections: Moderately Integrated (10/19/2023)  Tobacco Use: Medium Risk (10/19/2023)   SDOH Interventions:     Readmission Risk Interventions    10/22/2023   11:39 AM   Readmission Risk Prevention Plan  Post Dischage Appt Complete  Medication Screening Complete  Transportation Screening Complete

## 2023-10-22 NOTE — Progress Notes (Signed)
 VASCULAR LAB    Right lower extremity venous duplex has been performed.  See CV proc for preliminary results.   Elic Vencill, RVT 10/22/2023, 11:10 AM

## 2023-10-22 NOTE — Consult Note (Signed)
 [facility]    Date of Admission:  10/19/2023     Reason for Consult: cellulitis    Referring Provider: Sherrill     Abx: Vanc/ceftriaxone  Outpatient bactrim --> cipro        Assessment: 89 yo female with mild-mod dementia admitted 9/29 for rle cellulitis that fails on outpatient abx therapy   She took about a week bactrim then cipro, but rash didn't go away/got worse  No fever, chill  Blotchy/follicular quality but overall an improving diffuse erythematous rash. Pain/swelling/heat improving along with erythema  No use of bathtub/jacuzzi   All suggestive of a streptococcal non-staphylococcal process   Discuss with her that the redness/duskiness/hyperpigmentation can last a while and slowly improve, her pain/swelling/subjective wellness overall had continued to improve as excpected and can use this to monitor herself   Question if recently was true allergy or some infection related rash such as mycoplasma or other viral uri that triggered type 4 hypersensitive rash with abx.... she had been fine with penicillin products before that. Allergy evaluation outpatient would help  Plan: Stop current abx Start oral linezolid 600 mg bid for 10 more days Id clinic f/u 10/14 @ 830am Ok to discharge from id stand point Maintain standard isolation precaution while in house Please refer to outpatient penicillin allergy testing Id will sign off Discussed with dr Sherrill of [facility]      ------------------------------------------------ Principal Problem:   Cellulitis of right lower extremity Active Problems:   Hypertension   CKD (chronic kidney disease) stage 2, GFR 60-89 ml/min   Alzheimer dementia (HCC)   Hyperlipidemia   Asthma, chronic    HPI: Tiffany Bradshaw is a 89 y.o. female with mild-mod dementia admitted 9/29 for rle cellulitis that fails on outpatient abx therapy   She took about a week bactrim then cipro, but rash didn't  go away/got worse  No fever, chill  She had pain/swelling/diffuse red rash  No recurrent cellulitis. First episode actually. Hx of staphylococcus aureus abscess on upper extremity in the past  In August took augmentin  for a bronchitis and developed hives/rash entire body while on it. No anaphylaxis rash. Took penicillin products prior to that without problem   Overall here on vanc/ceftriaxone redness has improved along with pain/swelling  Afebrile here Xray right tib-fib shows sq edema  No other complaint  No pets/animals No hot tub/jacuzzi    History reviewed. No pertinent family history.  Social History   Tobacco Use   Smoking status: Former    Types: Cigarettes   Smokeless tobacco: Never  Substance Use Topics   Alcohol use: No   Drug use: No    Allergies  Allergen Reactions   Alendronate Other (See Comments)    Gi upset    Demerol [Meperidine]    Doxycycline  Itching   Augmentin  [Amoxicillin -Pot Clavulanate] Rash    Review of Systems: ROS All Other ROS was negative, except mentioned above   Past Medical History:  Diagnosis Date   Alzheimer dementia (HCC) 10/19/2023   Asthma, chronic 10/19/2023   CKD (chronic kidney disease) stage 2, GFR 60-89 ml/min 10/19/2023   Hyperlipidemia 10/19/2023   Hypertension    Otitis        Scheduled Meds:  atorvastatin  10 mg Oral Daily   enoxaparin (LOVENOX) injection  40 mg Subcutaneous Q24H   fluticasone  1 spray Each Nare Daily   linezolid  600 mg Oral Q12H   metoprolol succinate  25 mg Oral Daily   Continuous Infusions: PRN Meds:.acetaminophen **OR** acetaminophen, albuterol , oxyCODONE, polyethylene glycol, prochlorperazine   OBJECTIVE: Blood pressure 127/76, pulse 75, temperature 97.9 F (36.6 C), temperature source Oral, resp. rate 16, height 5' 3 (1.6 m), weight 66.5 kg, SpO2 95%.  Physical Exam  General/constitutional: no distress, pleasant HEENT: Normocephalic, PER, Conj Clear, EOMI, Oropharynx  clear Neck supple CV: rrr no mrg Lungs: clear to auscultation, normal respiratory effort Abd: Soft, Nontender Ext: no edema Skin: mild tenderness and swelling rle; erythematous and blotchy papular rash rle.  Neuro: nonfocal MSK: no peripheral joint swelling/tenderness/warmth; back spines nontender   Lab Results Lab Results  Component Value Date   WBC 2.7 (L) 10/22/2023   HGB 11.9 (L) 10/22/2023   HCT 37.4 10/22/2023   MCV 92.6 10/22/2023   PLT 200 10/22/2023    Lab Results  Component Value Date   CREATININE 0.77 10/22/2023   BUN 15 10/22/2023   NA 137 10/22/2023   K 3.9 10/22/2023   CL 103 10/22/2023   CO2 24 10/22/2023    Lab Results  Component Value Date   ALT 19 10/22/2023   AST 28 10/22/2023   ALKPHOS 58 10/22/2023   BILITOT 0.3 10/22/2023      Microbiology: Recent Results (from the past 240 hours)  MRSA Next Gen by PCR, Nasal     Status: None   Collection Time: 10/20/23 10:01 AM   Specimen: Nasal Mucosa; Nasal Swab  Result Value Ref Range Status   MRSA by PCR Next Gen NOT DETECTED NOT DETECTED Final    Comment: (NOTE) The GeneXpert MRSA Assay (FDA approved for NASAL specimens only), is one component of a comprehensive MRSA colonization surveillance program. It is not intended to diagnose MRSA infection nor to guide or monitor treatment for MRSA infections. Test performance is not FDA approved in patients less than 72 years old. Performed at [facility], [address], [place], [place] [address]      Serology:    Imaging: If present, new imagings (plain films, ct scans, and mri) have been personally visualized and interpreted; radiology reports have been reviewed. Decision making incorporated into the Impression / Recommendations.  9/30 rle xray Diffuse soft tissue swelling with no acute osseous abnormalities identified.  Tiffany ONEIDA Passer, MD [facility] [phone] pager     10/22/2023, 2:46 PM

## 2023-10-22 NOTE — Telephone Encounter (Signed)
 Patient Product/process development scientist completed.    The patient is insured through [employer]. Patient has Medicare and is not eligible for a copay card, but may be able to apply for patient assistance or Medicare RX Payment Plan (Patient Must reach out to their plan, if eligible for payment plan), if available.    Ran test claim for linezolid 600 mg and the current 10 day co-pay is $72.97.   This test claim was processed through [facility]- copay amounts may vary at other pharmacies due to pharmacy/plan contracts, or as the patient moves through the different stages of their insurance plan.     Reyes Sharps, CPHT Pharmacy Technician III Certified Patient Advocate [facility] Patient Advocate Team Direct Number: [phone]  Fax: [phone]

## 2023-10-22 NOTE — Plan of Care (Signed)

## 2023-10-23 DIAGNOSIS — N182 Chronic kidney disease, stage 2 (mild): Secondary | ICD-10-CM | POA: Diagnosis not present

## 2023-10-23 DIAGNOSIS — L03115 Cellulitis of right lower limb: Secondary | ICD-10-CM | POA: Diagnosis not present

## 2023-10-23 DIAGNOSIS — E785 Hyperlipidemia, unspecified: Secondary | ICD-10-CM | POA: Diagnosis not present

## 2023-10-23 DIAGNOSIS — G309 Alzheimer's disease, unspecified: Secondary | ICD-10-CM | POA: Diagnosis not present

## 2023-10-23 LAB — CBC WITH DIFFERENTIAL/PLATELET
Abs Immature Granulocytes: 0.01 K/uL (ref 0.00–0.07)
Basophils Absolute: 0 K/uL (ref 0.0–0.1)
Basophils Relative: 0 %
Eosinophils Absolute: 0.1 K/uL (ref 0.0–0.5)
Eosinophils Relative: 4 %
HCT: 41.9 % (ref 36.0–46.0)
Hemoglobin: 13 g/dL (ref 12.0–15.0)
Immature Granulocytes: 0 %
Lymphocytes Relative: 31 %
Lymphs Abs: 0.8 K/uL (ref 0.7–4.0)
MCH: 29.1 pg (ref 26.0–34.0)
MCHC: 31 g/dL (ref 30.0–36.0)
MCV: 93.7 fL (ref 80.0–100.0)
Monocytes Absolute: 0.2 K/uL (ref 0.1–1.0)
Monocytes Relative: 8 %
Neutro Abs: 1.5 K/uL — ABNORMAL LOW (ref 1.7–7.7)
Neutrophils Relative %: 57 %
Platelets: 207 K/uL (ref 150–400)
RBC: 4.47 MIL/uL (ref 3.87–5.11)
RDW: 14.2 % (ref 11.5–15.5)
WBC: 2.7 K/uL — ABNORMAL LOW (ref 4.0–10.5)
nRBC: 0 % (ref 0.0–0.2)

## 2023-10-23 LAB — MAGNESIUM: Magnesium: 1.9 mg/dL (ref 1.7–2.4)

## 2023-10-23 LAB — PHOSPHORUS: Phosphorus: 2.9 mg/dL (ref 2.5–4.6)

## 2023-10-23 LAB — COMPREHENSIVE METABOLIC PANEL WITH GFR
ALT: 27 U/L (ref 0–44)
AST: 37 U/L (ref 15–41)
Albumin: 3.5 g/dL (ref 3.5–5.0)
Alkaline Phosphatase: 67 U/L (ref 38–126)
Anion gap: 10 (ref 5–15)
BUN: 15 mg/dL (ref 8–23)
CO2: 28 mmol/L (ref 22–32)
Calcium: 9.4 mg/dL (ref 8.9–10.3)
Chloride: 102 mmol/L (ref 98–111)
Creatinine, Ser: 0.89 mg/dL (ref 0.44–1.00)
GFR, Estimated: >60 mL/min (ref >60)
Glucose, Bld: 106 mg/dL — ABNORMAL HIGH (ref 70–99)
Potassium: 3.8 mmol/L (ref 3.5–5.1)
Sodium: 141 mmol/L (ref 135–145)
Total Bilirubin: 0.4 mg/dL (ref 0.0–1.2)
Total Protein: 6 g/dL — ABNORMAL LOW (ref 6.5–8.1)

## 2023-10-23 MED ORDER — ACETAMINOPHEN 325 MG PO TABS: 650.0000 mg | ORAL_TABLET | Freq: Four times a day (QID) | ORAL | 0 refills | Status: AC | PRN

## 2023-10-23 MED ORDER — LINEZOLID 600 MG PO TABS: 600.0000 mg | ORAL_TABLET | Freq: Two times a day (BID) | ORAL | 0 refills | Status: AC

## 2023-10-23 MED ORDER — POLYETHYLENE GLYCOL 3350 17 G PO PACK: 17.0000 g | PACK | Freq: Every day | ORAL | 0 refills | Status: AC | PRN

## 2023-10-23 NOTE — Plan of Care (Signed)

## 2023-10-23 NOTE — Evaluation (Signed)
 Physical Therapy Evaluation Patient Details Name: Tiffany Bradshaw MRN: [id number] DOB: 1932/08/30 Today's Date: 10/23/2023  History of Present Illness  Patient is an 89 yo female presenting to the ED with leg swelling x1 week on 10/19/23. Admitted with cellulitis. PMH includes: hypertension, hyperlipidemia, asthma, CKD stage II, Alzheimer dementia  Clinical Impression  Pt admitted with above diagnosis.  She lives alone but daughter plans to stay with her a few days.  At baseline, pt tends to furniture surf  in home and denies falls.  Today, pt ambulating without AD but does occasionally use handrail.  Her DGI score was 14/24 which does indicate fall risk.  Did recommend getting RW to assist if ambulating in community and initially due to R foot edema/cellulitis.  Pt presents near baseline and has home support.  No further acute PT indicated.        If plan is discharge home, recommend the following: Assistance with cooking/housework;Help with stairs or ramp for entrance   Can travel by private vehicle        Equipment Recommendations Rolling walker (2 wheels)  Recommendations for Other Services       Functional Status Assessment Patient has not had a recent decline in their functional status     Precautions / Restrictions Precautions Precautions: Fall      Mobility  Bed Mobility               General bed mobility comments: up in recliner upon entry    Transfers Overall transfer level: Needs assistance Equipment used: None Transfers: Sit to/from Stand Sit to Stand: Supervision           General transfer comment: Close supervision for safety    Ambulation/Gait Ambulation/Gait assistance: Contact guard assist, Supervision Gait Distance (Feet): 300 Feet Assistive device: None Gait Pattern/deviations: Step-through pattern, Decreased stride length Gait velocity: decreased but functional     General Gait Details: Started CGA progressed to supervision; pt  occasionally using handrail  Stairs Stairs: Yes Stairs assistance: Contact guard assist Stair Management: One rail Right, Alternating pattern, Forwards Number of Stairs: 3    Wheelchair Mobility     Tilt Bed    Modified Rankin (Stroke Patients Only)       Balance Overall balance assessment: Needs assistance Sitting-balance support: No upper extremity supported Sitting balance-Leahy Scale: Good     Standing balance support: No upper extremity supported Standing balance-Leahy Scale: Good                   Standardized Balance Assessment Standardized Balance Assessment : Dynamic Gait Index   Dynamic Gait Index Level Surface: Mild Impairment Change in Gait Speed: Mild Impairment Gait with Horizontal Head Turns: Moderate Impairment Gait with Vertical Head Turns: Moderate Impairment Gait and Pivot Turn: Mild Impairment Step Over Obstacle: Mild Impairment Step Around Obstacles: Mild Impairment Steps: Mild Impairment Total Score: 14       Pertinent Vitals/Pain Pain Assessment Pain Assessment: No/denies pain    Home Living Family/patient expects to be discharged to:: Private residence Living Arrangements: Alone Available Help at Discharge: Family;Available 24 hours/day (dtr to stay a few days) Type of Home: Apartment Home Access: Level entry       Home Layout: One level Home Equipment: Grab bars - toilet;Grab bars - tub/shower      Prior Function Prior Level of Function : Independent/Modified Independent;Driving             Mobility Comments: independent, furniture surfs ADLs Comments: independent, daughters  help with medication, grocery shopping, and finances     Extremity/Trunk Assessment   Upper Extremity Assessment Upper Extremity Assessment: Defer to OT evaluation    Lower Extremity Assessment Lower Extremity Assessment: LLE deficits/detail;RLE deficits/detail RLE Deficits / Details: R LE cellulitis and edema but reports not painful;  ROM WFL; MMT 5/5 LLE Deficits / Details: ROM WFL; MMT 5/5    Cervical / Trunk Assessment Cervical / Trunk Assessment: Kyphotic  Communication   Communication Factors Affecting Communication: Hearing impaired    Cognition Arousal: Alert Behavior During Therapy: WFL for tasks assessed/performed   PT - Cognitive impairments: History of cognitive impairments                       PT - Cognition Comments: Oriented, following commands Following commands: Impaired Following commands impaired: Only follows one step commands consistently     Cueing       General Comments General comments (skin integrity, edema, etc.): Prefers to continue ambulation without AD, denies falls, but agreeable to taking RW for safety or to use if R foot painful    Exercises     Assessment/Plan    PT Assessment Patient does not need any further PT services  PT Problem List         PT Treatment Interventions      PT Goals (Current goals can be found in the Care Plan section)  Acute Rehab PT Goals Patient Stated Goal: return home PT Goal Formulation: All assessment and education complete, DC therapy    Frequency       Co-evaluation               AM-PAC PT 6 Clicks Mobility  Outcome Measure Help needed turning from your back to your side while in a flat bed without using bedrails?: None Help needed moving from lying on your back to sitting on the side of a flat bed without using bedrails?: None Help needed moving to and from a bed to a chair (including a wheelchair)?: A Little Help needed standing up from a chair using your arms (e.g., wheelchair or bedside chair)?: A Little Help needed to walk in hospital room?: A Little Help needed climbing 3-5 steps with a railing? : A Little 6 Click Score: 20    End of Session Equipment Utilized During Treatment: Gait belt Activity Tolerance: Patient tolerated treatment well Patient left: in chair;with call bell/phone within reach;with  chair alarm set;with family/visitor present Nurse Communication: Mobility status PT Visit Diagnosis: Other abnormalities of gait and mobility (R26.89)    Time: 1246-1300 PT Time Calculation (min) (ACUTE ONLY): 14 min   Charges:   PT Evaluation $PT Eval Low Complexity: 1 Low   PT General Charges $$ ACUTE PT VISIT: 1 Visit         Tiffany, PT Acute Rehab Services [facility] [phone]   Tiffany Bradshaw 10/23/2023, 1:34 PM

## 2023-10-23 NOTE — Discharge Summary (Signed)
 Physician Discharge Summary   Patient: Tiffany Bradshaw MRN: [id number] DOB: 20-Aug-1932  Admit date:     10/19/2023  Discharge date: 10/23/23  Discharge Physician: Alejandro Marker, DO   PCP: Ransom Other, MD   Recommendations at discharge:   Follow-up with PCP within 1 to 2 weeks repeat CBC, CMP, mag, Phos within 1 week Follow-up with infectious diseases on 11/03/2023  Discharge Diagnoses: Principal Problem:   Cellulitis of right lower extremity Active Problems:   Hypertension   CKD (chronic kidney disease) stage 2, GFR 60-89 ml/min   Alzheimer dementia (HCC)   Hyperlipidemia   Asthma, chronic  Resolved Problems:   * No resolved hospital problems. [facility] Course: The patient is an 89 y.o. female with medical history significant for HTN, HLD, CKD stage II, Alzheimer dementia, and asthma who presented with redness, pain, and swelling involving the lower right leg despite outpatient antibiotics.  She was on Bactrim for a few days which was changed over to ciprofloxacin which she took for maybe 1 to 2 days. Now brought in due to failing outpatient Bactrim and ciprofloxacin and on IV Ceftriaxone and Vancomycin and improving slowly.  Given her lack of further improvement and slow progress ID was consulted and have discontinued her current medications and started her on oral Linezolid 6 mg p.o. twice daily for 10 days.  Assessment and Plan:  Right Lower Extremity Cellulitis: She failed outpatient treatment with Bactrim and with ciprofloxacin.  Noted to be afebrile.  She actually has leukopenia on blood work and remains Leukopenic. Patient currently noted to be on ceftriaxone and vancomycin.  Follow-up on blood cultures.  Await improvement in the cellulitis.  Low suspicion for DVT but will still r/o and LE  Venous Duplex on the Right showed no DVT. Will obtain a plain film of the right lower extremity and it showed Diffuse soft tissue swelling with no acute osseous abnormalities identified.  If not improving will pursue CT Scan of the Leg. ID consulted and commending stopping antibiotics currently and changing to oral linezolid 600 mg p.o. twice daily for 10 more days and following up in ID clinic on 11/03/2023 at 8:30 AM -Will obtain PT/OT to Evaluate and Treat and they are recommending rolling walker for discharge.  She is medically stable and will need to follow-up with PCP and infectious disease within 1 to 2 weeks   Hypokalemia: Improved. K+ is now 3.8 at the time of discharge. CTM and Replete as Necessary. Repeat CMP in the AM   Acute on Chronic kidney disease stage II: BUN/Cr Trend Stable and improved: Recent Labs  Lab 10/19/23 1757 10/20/23 0413 10/21/23 0321 10/22/23 0319 10/23/23 0850  BUN 17 15 16 15 15   CREATININE 1.13* 0.93 0.92 0.77 0.89  -Avoid Nephrotoxic Medications, Contrast Dyes, Hypotension and Dehydration to Ensure Adequate Renal Perfusion and will need to Renally Adjust Meds; CTM and Trend Renal Function carefully and repeat CMP within 1 week   Essential Hypertension: HCTZ on hold currently.  Continue Metoprolol Succinate 25 mg po Daily. CTM BP per Protocol. Last BP reading was 127/76   History of asthma: Stable.   Dementia: Stable; C/w Delirium Precautions   Leukopenia: WBC Trend:  Recent Labs  Lab 10/19/23 1757 10/20/23 0413 10/21/23 0321 10/22/23 0319 10/23/23 0850  WBC 3.9* 3.3* 2.9* 2.7* 2.7*  -On Abx as above. CTM for S/Sx of Infection and ID is changing Abx as above. Repeat CBC within 1 week  Normocytic Anemia: Hgb/Hct Trend:  Recent Labs  Lab 10/19/23 1757 10/20/23 0413 10/21/23 0321 10/22/23 0319 10/23/23 0850  HGB 14.6 12.1 11.8* 11.9* 13.0  HCT 44.6 37.5 36.0 37.4 41.9  MCV 93.3 91.5 93.3 92.6 93.7  - Anemia panel was checked and showed an iron level of 35, UIBC of 276, TIBC 311, saturation ratios of 11%, ferritin level 134, folate level of 12.1 and vitamin B12 378. CTM for S/Sx of Bleeding; No overt bleeding noted. Repeat CBC  within 1 week  Overweight: Complicates overall prognosis and care. Estimated body mass index is 25.95 kg/m as calculated from the following:   Height as of this encounter: 5' 3 (1.6 m).   Weight as of this encounter: 66.5 kg. Weight Loss and Dietary Counseling given  Consultants: Infectious diseases Procedures performed: As delineated as above; lower extremity venous duplex Disposition: Home  Diet recommendation:  Discharge Diet Orders (From admission, onward)     Start     Ordered   10/23/23 0000  Diet general        10/23/23 1222           Cardiac diet DISCHARGE MEDICATION: Allergies as of 10/23/2023       Reactions   Alendronate Other (See Comments)   Gi upset    Demerol [meperidine]    Doxycycline  Itching   Augmentin  [amoxicillin -pot Clavulanate] Rash        Medication List     STOP taking these medications    amoxicillin -clavulanate 875-125 MG tablet Commonly known as: AUGMENTIN    azelastine  0.1 % nasal spray Commonly known as: ASTELIN    benzonatate  200 MG capsule Commonly known as: TESSALON    ciprofloxacin 500 MG tablet Commonly known as: CIPRO   sulfamethoxazole-trimethoprim 800-160 MG tablet Commonly known as: BACTRIM DS       TAKE these medications    acetaminophen 325 MG tablet Commonly known as: TYLENOL Take 2 tablets (650 mg total) by mouth every 6 (six) hours as needed for mild pain (pain score 1-3) or fever (or Fever >/= 101).   albuterol  108 (90 Base) MCG/ACT inhaler Commonly known as: VENTOLIN  HFA Inhale 2 puffs into the lungs every 4 (four) hours as needed.   atorvastatin 10 MG tablet Commonly known as: LIPITOR Take 10 mg by mouth daily.   fluticasone 50 MCG/ACT nasal spray Commonly known as: FLONASE 2 spray in each nostril Nasally Once a day; Duration: 30 days   hydrochlorothiazide 25 MG tablet Commonly known as: HYDRODIURIL Take 25 mg by mouth daily.   linezolid 600 MG tablet Commonly known as: ZYVOX Take 1 tablet  (600 mg total) by mouth every 12 (twelve) hours.   metoprolol succinate 25 MG 24 hr tablet Commonly known as: TOPROL-XL Take 25 mg by mouth daily.   polyethylene glycol 17 g packet Commonly known as: MIRALAX / GLYCOLAX Take 17 g by mouth daily as needed for mild constipation.   Vitamin C 500 MG Chew Chew 1 mg by mouth daily.   Vitamin D (Ergocalciferol) 1.25 MG (50000 UNIT) Caps capsule Commonly known as: DRISDOL Take 1 capsule by mouth. Twice Monthly        Discharge Exam: [facility] Weights   10/19/23 2028 10/20/23 0327  Weight: 73.3 kg 66.5 kg   Vitals:   10/23/23 0643 10/23/23 1040  BP: 122/66 124/63  Pulse: 63 60  Resp: 16 19  Temp: 98.1 F (36.7 C)   SpO2: 96% 98%   Examination: Physical Exam:  Constitutional: Thin elderly Caucasian female in no acute distress appears calm Respiratory: Diminished to  auscultation bilaterally, no wheezing, rales, rhonchi or crackles. Normal respiratory effort and patient is not tachypenic. No accessory muscle use.  Unlabored breathing Cardiovascular: RRR, no murmurs / rubs / gallops. S1 and S2 auscultated. No extremity edema.  Abdomen: Soft, non-tender, non-distended.  Bowel sounds positive.  GU: Deferred. Musculoskeletal: No clubbing / cyanosis of digits/nails. No joint deformity upper and lower extremities.  Skin: Has right leg erythema, warmth and splotchiness with some swelling but this is improving from the time of admission Neurologic: CN 2-12 grossly intact with no focal deficits. Romberg sign and cerebellar reflexes not assessed.  Psychiatric: Normal judgment and insight. Alert and oriented x 3. Normal mood and appropriate affect.    Condition at discharge: stable  The results of significant diagnostics from this hospitalization (including imaging, microbiology, ancillary and laboratory) are listed below for reference.   Imaging Studies: VAS US  LOWER EXTREMITY VENOUS (DVT) Result Date: 10/22/2023  Lower Venous DVT Study  Patient Name:  JONNAE FONSECA  Date of Exam:   10/22/2023 Medical Rec #: [id number]         Accession #:    [id number] Date of Birth: [birth year]         Patient Gender: F Patient Age:   70 years Exam Location:  [facility] Procedure:      VAS US  LOWER EXTREMITY VENOUS (DVT) Referring Phys: ALEJANDRO MARKER --------------------------------------------------------------------------------  Indications: Pain, Swelling, Erythema, and Cellulitis.  Comparison Study: No prior study on file Performing Technologist: Alberta Lis RVS  Examination Guidelines: A complete evaluation includes B-mode imaging, spectral Doppler, color Doppler, and power Doppler as needed of all accessible portions of each vessel. Bilateral testing is considered an integral part of a complete examination. Limited examinations for reoccurring indications may be performed as noted. The reflux portion of the exam is performed with the patient in reverse Trendelenburg.  +---------+---------------+---------+-----------+----------+--------------+ RIGHT    CompressibilityPhasicitySpontaneityPropertiesThrombus Aging +---------+---------------+---------+-----------+----------+--------------+ CFV      Full           No       No                                  +---------+---------------+---------+-----------+----------+--------------+ SFJ      Full                                                        +---------+---------------+---------+-----------+----------+--------------+ FV Prox  Full           Yes      No                                  +---------+---------------+---------+-----------+----------+--------------+ FV Mid   Full                                                        +---------+---------------+---------+-----------+----------+--------------+ FV DistalFull           Yes      No                                  +---------+---------------+---------+-----------+----------+--------------+  PFV       Full           Yes      No                                  +---------+---------------+---------+-----------+----------+--------------+ POP      Full           Yes      No                                  +---------+---------------+---------+-----------+----------+--------------+ PTV      Full                                                        +---------+---------------+---------+-----------+----------+--------------+ PERO     Full                                                        +---------+---------------+---------+-----------+----------+--------------+ Gastroc  Full                                                        +---------+---------------+---------+-----------+----------+--------------+   +----+---------------+---------+-----------+----------+--------------+ LEFTCompressibilityPhasicitySpontaneityPropertiesThrombus Aging +----+---------------+---------+-----------+----------+--------------+ CFV Full           Yes      No                                  +----+---------------+---------+-----------+----------+--------------+ SFJ Full                                                        +----+---------------+---------+-----------+----------+--------------+    Summary: RIGHT: - There is no evidence of deep vein thrombosis in the lower extremity.  - No cystic structure found in the popliteal fossa.  LEFT: - No evidence of common femoral vein obstruction.   *See table(s) above for measurements and observations. Electronically signed by Debby Robertson on 10/22/2023 at 5:39:15 PM.    Final    DG Tibia/Fibula Right Port Result Date: 10/20/2023 CLINICAL DATA:  Pain and redness in the right lower leg EXAM: PORTABLE RIGHT TIBIA AND FIBULA - 2 VIEW COMPARISON:  None Available. FINDINGS: Diffuse soft tissue swelling throughout the right leg. No acute fractures identified. Knee and ankle joints are approximated. Soft tissue calcifications about the lower  leg. IMPRESSION: Diffuse soft tissue swelling with no acute osseous abnormalities identified. Electronically Signed   By: Michaeline Blanch M.D.   On: 10/20/2023 14:02    Microbiology: Results for orders placed or performed during the hospital encounter of 10/19/23  Blood culture (routine x 2)     Status: None   Collection Time: 10/19/23  8:02  PM   Specimen: BLOOD  Result Value Ref Range Status   Specimen Description   Final    BLOOD RIGHT ANTECUBITAL Performed at [facility], [address], [place], [place] [address]    Special Requests   Final    Blood Culture adequate volume BOTTLES DRAWN AEROBIC AND ANAEROBIC Performed at [facility], [address] [place], [place] [address]    Culture   Final    NO GROWTH 5 DAYS Performed at [facility], [address] [place], [place] [address]    Report Status 10/25/2023 FINAL  Final  Blood culture (routine x 2)     Status: None   Collection Time: 10/19/23  8:20 PM   Specimen: BLOOD  Result Value Ref Range Status   Specimen Description   Final    BLOOD LEFT ANTECUBITAL Performed at [facility], [address] [place], [place] [address]    Special Requests   Final    Blood Culture results may not be optimal due to an inadequate volume of blood received in culture bottles BOTTLES DRAWN AEROBIC AND ANAEROBIC Performed at [facility], [address] [place], [place] [address]    Culture   Final    NO GROWTH 5 DAYS Performed at [facility], [address] [place], [place] [address]    Report Status 10/25/2023 FINAL  Final  MRSA Next Gen by PCR, Nasal     Status: None   Collection Time: 10/20/23 10:01 AM   Specimen: Nasal Mucosa; Nasal Swab  Result Value Ref Range Status   MRSA by PCR Next Gen NOT DETECTED NOT DETECTED Final    Comment: (NOTE) The GeneXpert MRSA Assay (FDA approved for NASAL specimens only), is one component of a  comprehensive MRSA colonization surveillance program. It is not intended to diagnose MRSA infection nor to guide or monitor treatment for MRSA infections. Test performance is not FDA approved in patients less than 36 years old. Performed at [facility], [address], [place], [place] [address]    Labs: CBC: Recent Labs  Lab 10/19/23 1757 10/20/23 0413 10/21/23 0321 10/22/23 0319 10/23/23 0850  WBC 3.9* 3.3* 2.9* 2.7* 2.7*  NEUTROABS 2.5  --   --  1.2* 1.5*  HGB 14.6 12.1 11.8* 11.9* 13.0  HCT 44.6 37.5 36.0 37.4 41.9  MCV 93.3 91.5 93.3 92.6 93.7  PLT 243 208 192 200 207   Basic Metabolic Panel: Recent Labs  Lab 10/19/23 1757 10/20/23 0413 10/21/23 0321 10/22/23 0319 10/23/23 0850  NA 134* 137 138 137 141  K 3.4* 3.4* 4.0 3.9 3.8  CL 98 102 103 103 102  CO2 21* 24 25 24 28   GLUCOSE 79 87 78 78 106*  BUN 17 15 16 15 15   CREATININE 1.13* 0.93 0.92 0.77 0.89  CALCIUM 10.1 8.9 9.3 8.7* 9.4  MG  --  1.8  --  2.0 1.9  PHOS  --   --   --  2.7 2.9   Liver Function Tests: Recent Labs  Lab 10/19/23 1757 10/22/23 0319 10/23/23 0850  AST 32 28 37  ALT 21 19 27   ALKPHOS 77 58 67  BILITOT 0.6 0.3 0.4  PROT 7.6 5.6* 6.0*  ALBUMIN 4.2 3.3* 3.5   CBG: No results for input(s): GLUCAP in the last 168 hours.  Discharge time spent: greater than 30 minutes.  Signed: Alejandro Marker, DO [facility] 10/25/2023

## 2023-10-23 NOTE — TOC Transition Note (Signed)
 Transition of Care [facility]) - Discharge Note   Patient Details  Name: Tiffany Bradshaw MRN: [id number] Date of Birth: [birth year]  Transition of Care [facility] CM/SW Contact:  NORMAN ASPEN, LCSW Phone Number: 10/23/2023, 1:31 PM   Clinical Narrative:     Pt medically cleared for dc and PT has recommended RW for home.  No DME agency preference.  RW ordered via [employer] and item delivered to room.  No further IP CM needs.  Final next level of care: [facility] Barriers to Discharge: Barriers Resolved   Patient Goals and CMS Choice Patient states their goals for this hospitalization and ongoing recovery are:: return home          Discharge Placement                       Discharge Plan and Services Additional resources added to the After Visit Summary for                  DME Arranged: Walker rolling DME Agency: [employer] Date DME Agency Contacted: 10/23/23                Social Drivers of Health (SDOH) Interventions SDOH Screenings   Food Insecurity: No Food Insecurity (10/19/2023)  Housing: Low Risk  (10/19/2023)  Transportation Needs: No Transportation Needs (10/19/2023)  Utilities: Not At Risk (10/19/2023)  Social Connections: Moderately Integrated (10/19/2023)  Tobacco Use: Medium Risk (10/19/2023)     Readmission Risk Interventions    10/22/2023   11:39 AM  Readmission Risk Prevention Plan  Post Dischage Appt Complete  Medication Screening Complete  Transportation Screening Complete

## 2023-10-23 NOTE — Evaluation (Signed)
 Occupational Therapy Evaluation Patient Details Name: Tiffany Bradshaw MRN: [id number] DOB: [birth year] Today's Date: 10/23/2023   History of Present Illness   Patient is an 89 yo female presenting to the ED with leg swelling x1 week on 10/19/23. Admitted with cellulitis. PMH includes: hypertension, hyperlipidemia, asthma, CKD stage II, Alzheimer dementia     Clinical Impressions Prior to this admission, patient living alone, with daughters checking on her 3x a week, and her daughters manage medications, finances, and assist with grocery shopping. Patient still drives short distances. Currently, patient is back to her baseline, with minimal edema in her RLE but improving. Daughter present for education throughout, and will provide 24/7 supervision initially till patient is back on her routine. OT recommending RW at discharge. OT will sign off as patient has no further acute OT needs. Please re-consult if further acute needs arise.      If plan is discharge home, recommend the following:   A little help with bathing/dressing/bathroom;Direct supervision/assist for medications management;Direct supervision/assist for financial management;Assist for transportation;Supervision due to cognitive status;Assistance with cooking/housework (initially)     Functional Status Assessment   Patient has had a recent decline in their functional status and demonstrates the ability to make significant improvements in function in a reasonable and predictable amount of time.     Equipment Recommendations   None recommended by OT     Recommendations for Other Services         Precautions/Restrictions   Precautions Precautions: Fall Restrictions Weight Bearing Restrictions Per Provider Order: No     Mobility Bed Mobility               General bed mobility comments: up in recliner upon OT entry    Transfers Overall transfer level: Needs assistance Equipment used: Rolling walker (2  wheels) Transfers: Sit to/from Stand Sit to Stand: Supervision           General transfer comment: Close supervision for safety      Balance Overall balance assessment: Mild deficits observed, not formally tested                                         ADL either performed or assessed with clinical judgement   ADL Overall ADL's : At baseline                                       General ADL Comments: Prior to this admission, patient living alone, with daughters checking on her 3x a week, and her daughters manage medications, finances, and assist with grocery shopping. Patient still drives short distances. Currently, patient is back to her baseline, with minimal edema in her RLE but improving. Daughter present for education throughout, and will provide 24/7 supervision initially till patient is back on her routine. OT recommending RW at discharge. OT will sign off as patient has no further acute OT needs. Please re-consult if further acute needs arise.     Vision Baseline Vision/History: 1 Wears glasses Ability to See in Adequate Light: 0 Adequate Patient Visual Report: No change from baseline Vision Assessment?: Wears glasses for reading     Perception Perception: Not tested       Praxis Praxis: Not tested       Pertinent Vitals/Pain Pain Assessment Pain Assessment: No/denies pain  Extremity/Trunk Assessment Upper Extremity Assessment Upper Extremity Assessment: LUE deficits/detail LUE Deficits / Details: shoulder flexion limited to 90 degrees, did have a total shoulder on this arm and is her baseline, 3/5 weakness LUE Sensation: WNL LUE Coordination: decreased gross motor   Lower Extremity Assessment Lower Extremity Assessment: Defer to PT evaluation   Cervical / Trunk Assessment Cervical / Trunk Assessment: Normal   Communication Communication Communication: Impaired Factors Affecting Communication: Hearing impaired    Cognition Arousal: Alert Behavior During Therapy: WFL for tasks assessed/performed Cognition: History of cognitive impairments             OT - Cognition Comments: Daughter present, states patient is at her baseline                 Following commands: Impaired Following commands impaired: Follows multi-step commands inconsistently     Cueing  General Comments   Cueing Techniques: Verbal cues  edema at RLE, but improving   Exercises     Shoulder Instructions      Home Living Family/patient expects to be discharged to:: Private residence Living Arrangements: Alone Available Help at Discharge: Family;Available 24 hours/day Type of Home: Apartment Home Access: Level entry     Home Layout: One level     Bathroom Shower/Tub: Producer, television/film/video: Handicapped height     Home Equipment: Grab bars - toilet;Grab bars - tub/shower          Prior Functioning/Environment Prior Level of Function : Independent/Modified Independent;Driving             Mobility Comments: independent, furniture surfs ADLs Comments: independent, daughters help with medication, grocery shopping, and finances    OT Problem List: Decreased activity tolerance;Impaired balance (sitting and/or standing);Decreased safety awareness   OT Treatment/Interventions:        OT Goals(Current goals can be found in the care plan section)   Acute Rehab OT Goals Patient Stated Goal: to go home OT Goal Formulation: With patient/family Time For Goal Achievement: 11/06/23 Potential to Achieve Goals: Good   OT Frequency:       Co-evaluation              AM-PAC OT 6 Clicks Daily Activity     Outcome Measure Help from another person eating meals?: None Help from another person taking care of personal grooming?: None Help from another person toileting, which includes using toliet, bedpan, or urinal?: None Help from another person bathing (including washing, rinsing,  drying)?: A Little Help from another person to put on and taking off regular upper body clothing?: None Help from another person to put on and taking off regular lower body clothing?: None 6 Click Score: 23   End of Session Equipment Utilized During Treatment: Gait belt;Rolling walker (2 wheels) Nurse Communication: Mobility status  Activity Tolerance: Patient tolerated treatment well Patient left: in chair;with call bell/phone within reach;with chair alarm set;with family/visitor present  OT Visit Diagnosis: Unsteadiness on feet (R26.81);Other abnormalities of gait and mobility (R26.89);Muscle weakness (generalized) (M62.81)                Time: 9085-9061 OT Time Calculation (min): 24 min Charges:  OT General Charges $OT Visit: 1 Visit OT Evaluation $OT Eval Moderate Complexity: 1 Mod OT Treatments $Self Care/Home Management : 8-22 mins  Ronal Gift E. Zimal Weisensel, OTR/L Acute Rehabilitation Services [phone]   Ronal Gift Salt 10/23/2023, 10:45 AM

## 2023-10-25 LAB — CULTURE, BLOOD (ROUTINE X 2)
Culture: NO GROWTH
Culture: NO GROWTH
Special Requests: ADEQUATE

## 2023-10-30 DIAGNOSIS — L03115 Cellulitis of right lower limb: Secondary | ICD-10-CM | POA: Diagnosis not present

## 2023-11-03 ENCOUNTER — Ambulatory Visit (INDEPENDENT_AMBULATORY_CARE_PROVIDER_SITE_OTHER): Payer: Self-pay | Admitting: Internal Medicine

## 2023-11-03 ENCOUNTER — Other Ambulatory Visit: Payer: Self-pay

## 2023-11-03 VITALS — BP 134/69 | HR 66 | Temp 97.5°F | Resp 16 | Wt 152.0 lb

## 2023-11-03 DIAGNOSIS — I878 Other specified disorders of veins: Secondary | ICD-10-CM | POA: Diagnosis not present

## 2023-11-03 DIAGNOSIS — L03115 Cellulitis of right lower limb: Secondary | ICD-10-CM | POA: Diagnosis not present

## 2023-11-03 MED ORDER — ZIKS ARTHRITIS PAIN RELIEF 0.025-1-12 % EX CREA: 1.0000 | TOPICAL_CREAM | Freq: Three times a day (TID) | CUTANEOUS | 1 refills | Status: AC | PRN

## 2023-11-03 MED ORDER — TRIAMCINOLONE ACETONIDE 0.025 % EX OINT: 1.0000 | TOPICAL_OINTMENT | Freq: Two times a day (BID) | CUTANEOUS | 0 refills | Status: AC

## 2023-11-03 NOTE — Progress Notes (Signed)
 [facility]  Patient Active Problem List   Diagnosis Date Noted   Cellulitis of right lower extremity 10/19/2023   CKD (chronic kidney disease) stage 2, GFR 60-89 ml/min 10/19/2023   Alzheimer dementia (HCC) 10/19/2023   Hyperlipidemia 10/19/2023   Asthma, chronic 10/19/2023   Hypertension       Subjective:    Patient ID: Tiffany Bradshaw, female    DOB: 1932-06-06, 89 y.o.   MRN: [id number]  Chief Complaint  Patient presents with   Hospitalization Follow-up    Cellulitis - pt reports it has improved, has intermittent pain when touching leg.     HPI:  Tiffany Bradshaw is a 89 y.o. female here for f/u cellulitis rle  She finished abx yesterday No redness No n/v/d Still tender in the rle  Using compression stocking said it helps with swelling Feels well Ambulatory  Here with her daughter today   Allergies  Allergen Reactions   Alendronate Other (See Comments)    Gi upset    Demerol [Meperidine]    Doxycycline  Itching   Augmentin  [Amoxicillin -Pot Clavulanate] Rash      Outpatient Medications Prior to Visit  Medication Sig Dispense Refill   acetaminophen (TYLENOL) 325 MG tablet Take 2 tablets (650 mg total) by mouth every 6 (six) hours as needed for mild pain (pain score 1-3) or fever (or Fever >/= 101). 20 tablet 0   albuterol  (VENTOLIN  HFA) 108 (90 Base) MCG/ACT inhaler Inhale 2 puffs into the lungs every 4 (four) hours as needed. 18 g 0   Ascorbic Acid (VITAMIN C) 500 MG CHEW Chew 1 mg by mouth daily.     atorvastatin (LIPITOR) 10 MG tablet Take 10 mg by mouth daily.     fluticasone (FLONASE) 50 MCG/ACT nasal spray 2 spray in each nostril Nasally Once a day; Duration: 30 days     hydrochlorothiazide (HYDRODIURIL) 25 MG tablet Take 25 mg by mouth daily.     metoprolol succinate (TOPROL-XL) 25 MG 24 hr tablet Take 25 mg by mouth daily.     polyethylene glycol (MIRALAX / GLYCOLAX) 17 g packet Take 17 g by mouth daily as  needed for mild constipation. 14 each 0   Vitamin D, Ergocalciferol, (DRISDOL) 1.25 MG (50000 UNIT) CAPS capsule Take 1 capsule by mouth. Twice Monthly     linezolid (ZYVOX) 600 MG tablet Take 1 tablet (600 mg total) by mouth every 12 (twelve) hours. (Patient not taking: Reported on 11/03/2023) 20 tablet 0   No facility-administered medications prior to visit.     Social History   Socioeconomic History   Marital status: Divorced    Spouse name: Not on file   Number of children: Not on file   Years of education: Not on file   Highest education level: Not on file  Occupational History   Not on file  Tobacco Use   Smoking status: Former    Types: Cigarettes   Smokeless tobacco: Never  Substance and Sexual Activity   Alcohol use: No   Drug use: No   Sexual activity: Not on file  Other Topics Concern   Not on file  Social History Narrative   Not on file   Social Drivers of Health   Financial Resource Strain: Not on file  Food Insecurity: No Food Insecurity (10/19/2023)   Hunger Vital Sign    Worried About Running Out of Food in the Last Year: Never true    Ran  Out of Food in the Last Year: Never true  Transportation Needs: No Transportation Needs (10/19/2023)   PRAPARE - Administrator, Civil Service (Medical): No    Lack of Transportation (Non-Medical): No  Physical Activity: Not on file  Stress: Not on file  Social Connections: Moderately Integrated (10/19/2023)   Social Connection and Isolation Panel    Frequency of Communication with Friends and Family: More than three times a week    Frequency of Social Gatherings with Friends and Family: More than three times a week    Attends Religious Services: More than 4 times per year    Active Member of [employer] or Organizations: Yes    Attends Banker Meetings: More than 4 times per year    Marital Status: Divorced  Intimate Partner Violence: Not At Risk (10/19/2023)   Humiliation, Afraid, Rape, and Kick  questionnaire    Fear of Current or Ex-Partner: No    Emotionally Abused: No    Physically Abused: No    Sexually Abused: No      Review of Systems    All other ros negative  Objective:    BP 134/69   Pulse 66   Temp (!) 97.5 F (36.4 C) (Temporal)   Resp 16   Wt 152 lb (68.9 kg)   SpO2 97%   BMI 26.93 kg/m  Nursing note and vital signs reviewed.  Physical Exam     General/constitutional: no distress, pleasant HEENT: Normocephalic, PER, Conj Clear, EOMI, Oropharynx clear Neck supple CV: rrr no mrg Lungs: clear to auscultation, normal respiratory effort Abd: Soft, Nontender Ext: bilateral 1+ edema r>l Skin/MSK: sensitive to touch right posterolateral distal lower ext; no increased warmth; no redness; no fluctuance  Labs: Lab Results  Component Value Date   WBC 2.7 (L) 10/23/2023   HGB 13.0 10/23/2023   HCT 41.9 10/23/2023   MCV 93.7 10/23/2023   PLT 207 10/23/2023   Last metabolic panel Lab Results  Component Value Date   GLUCOSE 106 (H) 10/23/2023   NA 141 10/23/2023   K 3.8 10/23/2023   CL 102 10/23/2023   CO2 28 10/23/2023   BUN 15 10/23/2023   CREATININE 0.89 10/23/2023   GFRNONAA >60 10/23/2023   CALCIUM 9.4 10/23/2023   PHOS 2.9 10/23/2023   PROT 6.0 (L) 10/23/2023   ALBUMIN 3.5 10/23/2023   BILITOT 0.4 10/23/2023   ALKPHOS 67 10/23/2023   AST 37 10/23/2023   ALT 27 10/23/2023   ANIONGAP 10 10/23/2023    Micro:  Serology:  Imaging:  Assessment & Plan:   Problem List Items Addressed This Visit     Cellulitis of right lower extremity - Primary   Other Visit Diagnoses       Venous stasis             No orders of the defined types were placed in this encounter. 11/03/23 id clinic assessment Patient finished abx yesterday No side effect from abx  Cellulitis resolved Residual allodynia/hypersensitivity and we can treat with capsaicin cream as needed  Bilateral leg swelling and suspect it could contribute to dermatitis  and prn kenalog  cream also given   Sign of cellulitis discussed and f/u as needed   Follow-up: No follow-ups on file.      Constance ONEIDA Passer, MD [facility] 11/03/2023, 8:40 AM

## 2023-11-03 NOTE — Patient Instructions (Signed)
 Your cellulitis had resolved  The sensitivity is likely related to nerve irritation; use capsaicin cream three times a day as needed (avoid touching eyes/private area with the cream)  The kenalog  ointment is use to calm the irritation from edema; use alternatively with the capsaicin cream   Follow up as needed if spreading redness recurs or fever chill within the next 2 weeks

## 2023-11-04 DIAGNOSIS — N179 Acute kidney failure, unspecified: Secondary | ICD-10-CM | POA: Diagnosis not present

## 2023-11-16 DIAGNOSIS — L821 Other seborrheic keratosis: Secondary | ICD-10-CM | POA: Diagnosis not present

## 2023-11-16 DIAGNOSIS — B078 Other viral warts: Secondary | ICD-10-CM | POA: Diagnosis not present

## 2023-11-16 DIAGNOSIS — L218 Other seborrheic dermatitis: Secondary | ICD-10-CM | POA: Diagnosis not present

## 2023-11-16 DIAGNOSIS — D225 Melanocytic nevi of trunk: Secondary | ICD-10-CM | POA: Diagnosis not present

## 2023-11-16 DIAGNOSIS — L820 Inflamed seborrheic keratosis: Secondary | ICD-10-CM | POA: Diagnosis not present

## 2023-12-16 DIAGNOSIS — E559 Vitamin D deficiency, unspecified: Secondary | ICD-10-CM | POA: Diagnosis not present

## 2023-12-16 DIAGNOSIS — E7800 Pure hypercholesterolemia, unspecified: Secondary | ICD-10-CM | POA: Diagnosis not present

## 2023-12-16 DIAGNOSIS — I10 Essential (primary) hypertension: Secondary | ICD-10-CM | POA: Diagnosis not present
